# Patient Record
Sex: Male | Born: 1965
Health system: Western US, Academic
[De-identification: ages and names within clinical notes are randomized; demographics above are authoritative.]

## PROBLEM LIST (undated history)

## (undated) DIAGNOSIS — I219 Acute myocardial infarction, unspecified: Secondary | ICD-10-CM

## (undated) DIAGNOSIS — M1990 Unspecified osteoarthritis, unspecified site: Secondary | ICD-10-CM

## (undated) DIAGNOSIS — E785 Hyperlipidemia, unspecified: Secondary | ICD-10-CM

## (undated) DIAGNOSIS — I10 Essential (primary) hypertension: Secondary | ICD-10-CM

## (undated) HISTORY — PX: OTHER SURGICAL HISTORY: SHX169

## (undated) HISTORY — DX: Hyperlipidemia, unspecified: E78.5

## (undated) HISTORY — PX: CARDIAC CATHETERIZATION: SHX172

## (undated) HISTORY — DX: Acute myocardial infarction, unspecified: I21.9

## (undated) HISTORY — PX: TUMOR REMOVAL: SHX12

---

## 2009-05-22 ENCOUNTER — Emergency Department (HOSPITAL_COMMUNITY): Admission: EM | Admit: 2009-05-22 | Discharge: 2009-05-22 | Payer: Self-pay | Admitting: Emergency Medicine

## 2009-07-07 ENCOUNTER — Emergency Department: Payer: Self-pay | Admitting: Emergency Medicine

## 2010-10-19 ENCOUNTER — Inpatient Hospital Stay: Payer: Self-pay | Admitting: Internal Medicine

## 2010-11-02 ENCOUNTER — Encounter: Payer: Self-pay | Admitting: Internal Medicine

## 2010-11-24 ENCOUNTER — Encounter: Payer: Self-pay | Admitting: Internal Medicine

## 2010-11-30 ENCOUNTER — Ambulatory Visit: Payer: Self-pay | Admitting: Internal Medicine

## 2010-12-25 ENCOUNTER — Encounter: Payer: Self-pay | Admitting: Internal Medicine

## 2011-01-24 ENCOUNTER — Encounter: Payer: Self-pay | Admitting: Internal Medicine

## 2011-02-14 ENCOUNTER — Ambulatory Visit: Payer: Self-pay

## 2011-02-14 ENCOUNTER — Other Ambulatory Visit: Payer: Self-pay | Admitting: Occupational Medicine

## 2011-02-14 DIAGNOSIS — Z0000 Encounter for general adult medical examination without abnormal findings: Secondary | ICD-10-CM

## 2013-04-08 ENCOUNTER — Other Ambulatory Visit: Payer: Self-pay | Admitting: Occupational Medicine

## 2013-04-08 ENCOUNTER — Ambulatory Visit: Payer: Self-pay

## 2013-04-08 DIAGNOSIS — Z0000 Encounter for general adult medical examination without abnormal findings: Secondary | ICD-10-CM

## 2013-05-07 ENCOUNTER — Encounter: Payer: Self-pay | Admitting: Family Medicine

## 2013-05-26 ENCOUNTER — Encounter: Payer: Self-pay | Admitting: Family Medicine

## 2013-06-25 ENCOUNTER — Encounter: Payer: Self-pay | Admitting: Family Medicine

## 2013-10-09 ENCOUNTER — Emergency Department: Payer: Self-pay | Admitting: Emergency Medicine

## 2013-10-22 ENCOUNTER — Ambulatory Visit: Payer: Self-pay | Admitting: Orthopedic Surgery

## 2014-03-04 DIAGNOSIS — F32A Depression, unspecified: Secondary | ICD-10-CM | POA: Insufficient documentation

## 2014-03-04 DIAGNOSIS — F329 Major depressive disorder, single episode, unspecified: Secondary | ICD-10-CM | POA: Insufficient documentation

## 2014-03-04 DIAGNOSIS — F419 Anxiety disorder, unspecified: Secondary | ICD-10-CM | POA: Insufficient documentation

## 2014-03-04 DIAGNOSIS — I10 Essential (primary) hypertension: Secondary | ICD-10-CM | POA: Insufficient documentation

## 2014-03-04 DIAGNOSIS — I2510 Atherosclerotic heart disease of native coronary artery without angina pectoris: Secondary | ICD-10-CM | POA: Insufficient documentation

## 2014-03-04 DIAGNOSIS — R0602 Shortness of breath: Secondary | ICD-10-CM | POA: Insufficient documentation

## 2015-01-14 ENCOUNTER — Emergency Department
Admit: 2015-01-14 | Disposition: A | Discharge status: Home / Self Care | Payer: Self-pay | Admitting: Emergency Medicine

## 2015-01-14 LAB — COMPREHENSIVE METABOLIC PANEL WITH GFR
Albumin: 4.3 g/dL
Alkaline Phosphatase: 53 U/L
Anion Gap: 7
BUN: 11 mg/dL
Bilirubin,Total: 0.7 mg/dL
Calcium, Total: 9.1 mg/dL
Chloride: 107 mmol/L
Co2: 25 mmol/L
Creatinine: 0.94 mg/dL
EGFR (African American): >60
EGFR (Non-African Amer.): >60
Glucose: 117 mg/dL — ABNORMAL HIGH
Potassium: 3.6 mmol/L
SGOT(AST): 23 U/L
SGPT (ALT): 17 U/L
Sodium: 139 mmol/L
Total Protein: 7.5 g/dL

## 2015-01-14 LAB — CBC WITH DIFFERENTIAL/PLATELET
BASOS ABS: 0 10*3/uL (ref 0.0–0.1)
Basophil %: 0.5 %
EOS PCT: 6.5 %
Eosinophil #: 0.3 10*3/uL (ref 0.0–0.7)
HCT: 43.3 % (ref 40.0–52.0)
HGB: 14.6 g/dL (ref 13.0–18.0)
LYMPHS PCT: 44.4 %
Lymphocyte #: 2.2 10*3/uL (ref 1.0–3.6)
MCH: 32 pg (ref 26.0–34.0)
MCHC: 33.7 g/dL (ref 32.0–36.0)
MCV: 95 fL (ref 80–100)
MONOS PCT: 9.2 %
Monocyte #: 0.5 x10 3/mm (ref 0.2–1.0)
NEUTROS ABS: 2 10*3/uL (ref 1.4–6.5)
Neutrophil %: 39.4 %
Platelet: 238 10*3/uL (ref 150–440)
RBC: 4.55 10*6/uL (ref 4.40–5.90)
RDW: 13.4 % (ref 11.5–14.5)
WBC: 5 10*3/uL (ref 3.8–10.6)

## 2015-01-27 DIAGNOSIS — K118 Other diseases of salivary glands: Secondary | ICD-10-CM | POA: Insufficient documentation

## 2015-01-27 DIAGNOSIS — R900 Intracranial space-occupying lesion found on diagnostic imaging of central nervous system: Secondary | ICD-10-CM | POA: Insufficient documentation

## 2015-11-28 IMAGING — CT CT NECK WITH CONTRAST
2 of 3 series · 7 of 14 positions shown, 8 images · IV contrast (omnipaque)
Comparison: Chest radiographs 04/08/2013.

CLINICAL DATA: 48-year-old male with palpable abnormality near the
right ear. Right cheek numbness. Denies associated pain. Initial
encounter.

EXAM:
CT NECK WITH CONTRAST
TECHNIQUE: Multidetector CT imaging of the neck was performed using the
standard protocol following the bolus administration of intravenous
contrast.
CONTRAST:  75 mL Omnipaque 300.

[Series 2: axial neck · axial · 0.53mm/px · z∈[+57,+201]mm · 3 of 144 slices shown]
[im 36/144  bone]
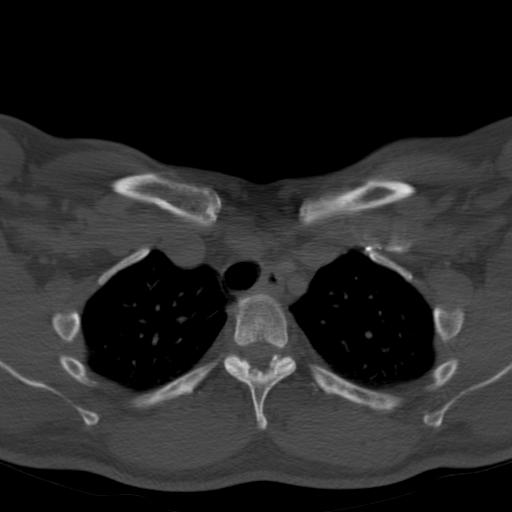
[im 72/144  bone]
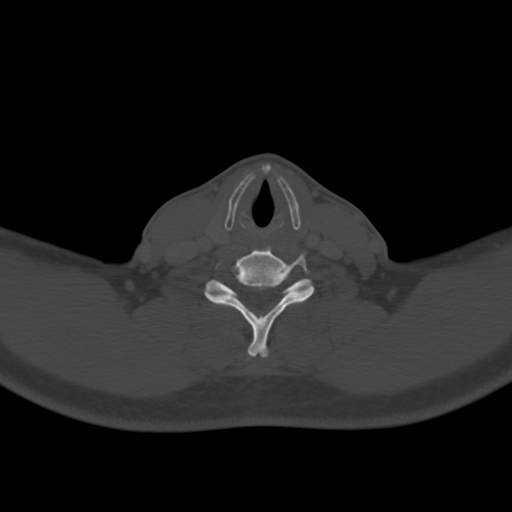
[im 108/144  bone]
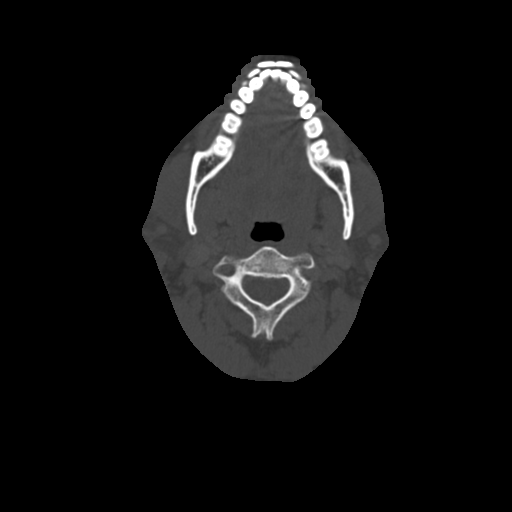

[Series 7: ax oropharynx · axial · 0.43mm/px · z∈[+23,+190]mm · 4 of 147 slices shown, 5 images]
[im 30/147  soft-tissue]
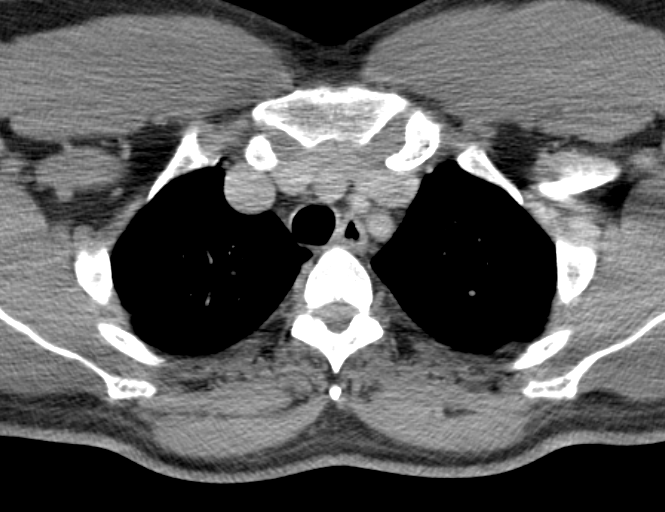
[im 30/147  bone]
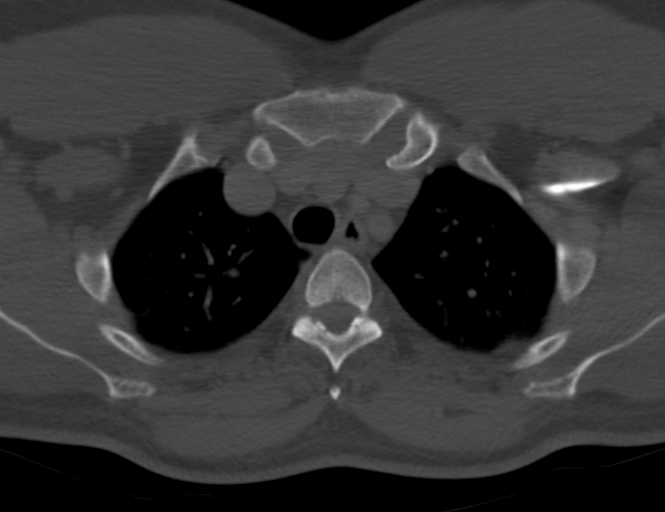
[im 59/147  bone]
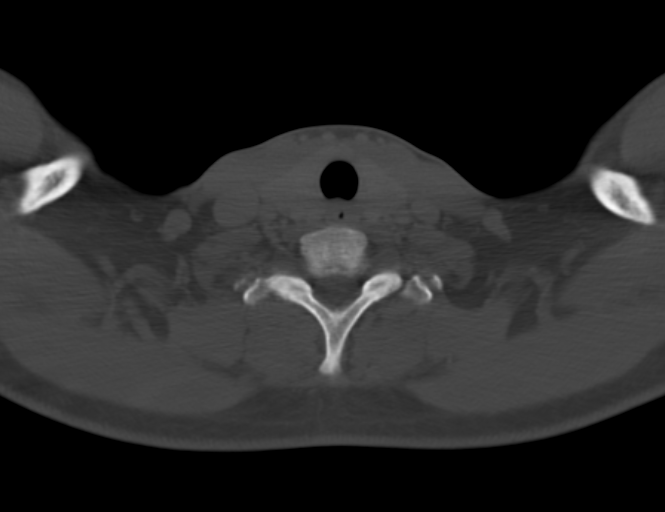
[im 88/147  bone]
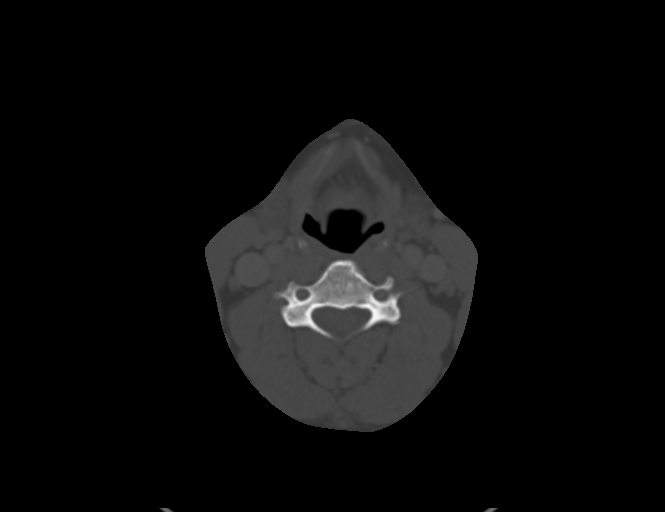
[im 117/147  bone]
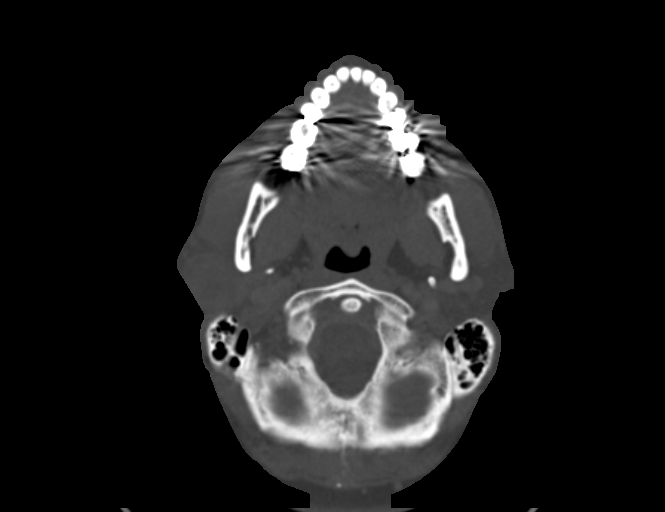

[7 of 14 positions shown; findings below may reference images not displayed]

FINDINGS: Pharynx and larynx: Negative. Negative parapharyngeal spaces,
retropharyngeal space and sublingual space.

Salivary glands:

Negative submandibular and left parotid glands.

The palpable area of concern is marked at the right parotid space
and there is a round heterogeneously enhancing 25 x 32 x 32 mm (AP
by transverse by CC) mass situated in the right parotid space. The
majority of the mass is in the superficial lobe, but it extends to
the level or just medial to the left retromandibular vein.
Questionable asymmetric soft tissue extending toward the right
stylomastoid foramen (series 2, image 19). No regional
lymphadenopathy. No other suspicious right parotid finding.

Thyroid: Negative.

Lymph nodes: No cervical lymphadenopathy.

Vascular: Patent, suboptimal intravascular contrast bolus.

Limited intracranial: Negative.

Visualized orbits: Negative.

Mastoids and visualized paranasal sinuses: The right tympanic cavity
and mastoid air cells are clear. There is bilateral petrous apex and
sphenoid wing pneumatization, those air cells also are clear. No
significant paranasal sinus mucosal thickening.

Skeleton: Degenerative changes in the cervical spine. No acute or
suspicious osseous lesion.

Upper chest: Negative lung apices. No superior mediastinal
lymphadenopathy. No axillary lymphadenopathy.
IMPRESSION: 1. Round 3 cm right parotid space mass most compatible with primary
salivary gland neoplasm. This imaging appearance can be benign or
malignant. Recommend ENT consultation for histologic correlation.
2. Questionable asymmetric soft tissue at the right stylomastoid
foramen. If there is any clinical evidence of right 7th cranial
nerve dysfunction then malignancy with right 7th CN involvement is
likely.
3. No regional lymphadenopathy, and other neck and upper chest soft
tissues are within normal limits.

## 2017-05-15 ENCOUNTER — Telehealth: Payer: Self-pay

## 2017-05-15 ENCOUNTER — Other Ambulatory Visit: Payer: Self-pay

## 2017-05-15 DIAGNOSIS — I219 Acute myocardial infarction, unspecified: Secondary | ICD-10-CM | POA: Insufficient documentation

## 2017-05-15 DIAGNOSIS — Z1211 Encounter for screening for malignant neoplasm of colon: Secondary | ICD-10-CM

## 2017-05-15 DIAGNOSIS — E785 Hyperlipidemia, unspecified: Secondary | ICD-10-CM | POA: Insufficient documentation

## 2017-05-15 NOTE — Telephone Encounter (Signed)
Gastroenterology Pre-Procedure Review  Request Date: 9/5 Requesting Physician: Dr. Allegra Lai  PATIENT REVIEW QUESTIONS: The patient responded to the following health history questions as indicated:    1. Are you having any GI issues? no 2. Do you have a personal history of Polyps? no 3. Do you have a family history of Colon Cancer or Polyps? no 4. Diabetes Mellitus? no 5. Joint replacements in the past 12 months?no 6. Major health problems in the past 3 months?no 7. Any artificial heart valves, MVP, or defibrillator?yes (2 stents due to MI)    MEDICATIONS & ALLERGIES:    Patient reports the following regarding taking any anticoagulation/antiplatelet therapy:   Plavix, Coumadin, Eliquis, Xarelto, Lovenox, Pradaxa, Brilinta, or Effient? yes (Effient) Aspirin? no  Patient confirms/reports the following medications:  No current outpatient prescriptions on file.   No current facility-administered medications for this visit.     Patient confirms/reports the following allergies:  Allergies not on file  No orders of the defined types were placed in this encounter.   AUTHORIZATION INFORMATION Primary Insurance: 1D#: Group #:  Secondary Insurance: 1D#: Group #:  SCHEDULE INFORMATION: Date: 9/5 Time: Location: [facility]

## 2017-05-25 ENCOUNTER — Telehealth: Payer: Self-pay

## 2017-05-25 NOTE — Telephone Encounter (Signed)
LVM for Carmelia Roller to have Erick Alley to call me for information.   Advised no voicemail setup on phone. No answer to call.   *Effient cleared by Dr. Juliann Pares. Stop 3 days prior. Restart 1 day following.

## 2017-05-29 ENCOUNTER — Ambulatory Visit: Payer: Self-pay

## 2017-05-29 ENCOUNTER — Other Ambulatory Visit: Payer: Self-pay | Admitting: Occupational Medicine

## 2017-05-29 ENCOUNTER — Encounter: Payer: Self-pay | Admitting: *Deleted

## 2017-05-29 ENCOUNTER — Other Ambulatory Visit: Payer: Self-pay

## 2017-05-29 DIAGNOSIS — Z86010 Personal history of colonic polyps: Secondary | ICD-10-CM

## 2017-05-29 DIAGNOSIS — Z0000 Encounter for general adult medical examination without abnormal findings: Secondary | ICD-10-CM

## 2017-05-29 MED ORDER — PEG 3350-KCL-NA BICARB-NACL 420 G PO SOLR: 4000.0000 mL | Freq: Once | ORAL | 0 refills | Status: DC

## 2017-05-29 MED ORDER — PEG 3350-KCL-NA BICARB-NACL 420 G PO SOLR: 4000.0000 mL | Freq: Once | ORAL | 0 refills | Status: AC

## 2017-06-01 ENCOUNTER — Ambulatory Visit
Admission: RE | Admit: 2017-06-01 | Discharge: 2017-06-01 | Disposition: A | Discharge status: Home / Self Care | Payer: Medicare Other | Source: Ambulatory Visit | Attending: Gastroenterology | Admitting: Gastroenterology

## 2017-06-01 ENCOUNTER — Encounter
Admission: RE | Disposition: A | Discharge status: Home / Self Care | Payer: Self-pay | Source: Ambulatory Visit | Attending: Gastroenterology

## 2017-06-01 ENCOUNTER — Telehealth: Payer: Self-pay

## 2017-06-01 ENCOUNTER — Ambulatory Visit: Payer: Medicare Other | Admitting: Anesthesiology

## 2017-06-01 ENCOUNTER — Encounter: Payer: Self-pay | Admitting: *Deleted

## 2017-06-01 DIAGNOSIS — I252 Old myocardial infarction: Secondary | ICD-10-CM | POA: Insufficient documentation

## 2017-06-01 DIAGNOSIS — I2510 Atherosclerotic heart disease of native coronary artery without angina pectoris: Secondary | ICD-10-CM | POA: Insufficient documentation

## 2017-06-01 DIAGNOSIS — Z79899 Other long term (current) drug therapy: Secondary | ICD-10-CM | POA: Diagnosis not present

## 2017-06-01 DIAGNOSIS — Z1211 Encounter for screening for malignant neoplasm of colon: Secondary | ICD-10-CM | POA: Diagnosis present

## 2017-06-01 DIAGNOSIS — Z955 Presence of coronary angioplasty implant and graft: Secondary | ICD-10-CM | POA: Insufficient documentation

## 2017-06-01 DIAGNOSIS — E785 Hyperlipidemia, unspecified: Secondary | ICD-10-CM | POA: Diagnosis not present

## 2017-06-01 HISTORY — PX: COLONOSCOPY WITH PROPOFOL: SHX5780

## 2017-06-01 SURGERY — COLONOSCOPY WITH PROPOFOL
Anesthesia: General

## 2017-06-01 MED ORDER — MIDAZOLAM HCL 2 MG/2ML IJ SOLN
INTRAMUSCULAR | Status: DC | PRN
  Administered 2017-06-01 (×2): 1 mg via INTRAVENOUS

## 2017-06-01 MED ORDER — PROPOFOL 500 MG/50ML IV EMUL
INTRAVENOUS | Status: AC
  Filled 2017-06-01: qty 50

## 2017-06-01 MED ORDER — SODIUM CHLORIDE 0.9 % IV SOLN
INTRAVENOUS | Status: DC
  Administered 2017-06-01: 11:00:00 via INTRAVENOUS

## 2017-06-01 MED ORDER — PROPOFOL 500 MG/50ML IV EMUL
INTRAVENOUS | Status: DC | PRN
  Administered 2017-06-01: 150 ug/kg/min via INTRAVENOUS

## 2017-06-01 MED ORDER — PROPOFOL 10 MG/ML IV BOLUS
INTRAVENOUS | Status: DC | PRN
  Administered 2017-06-01: 70 mg via INTRAVENOUS

## 2017-06-01 MED ORDER — LIDOCAINE HCL (CARDIAC) 20 MG/ML IV SOLN
INTRAVENOUS | Status: DC | PRN
  Administered 2017-06-01: 40 mg via INTRAVENOUS

## 2017-06-01 MED ORDER — MIDAZOLAM HCL 2 MG/2ML IJ SOLN
INTRAMUSCULAR | Status: AC
  Filled 2017-06-01: qty 2

## 2017-06-01 NOTE — Telephone Encounter (Signed)
LVM for patient callback to advise medication clearance received from Dr. Juliann Pares.   Stop Effient 3 day prior to procedure.  Re-start 1 day following.

## 2017-06-01 NOTE — Op Note (Signed)
[facility] Patient Name: Gregory Banks Procedure Date: 06/01/2017 11:55 AM MRN: [id number] Account #: [id number] Date of Birth: 02-Sep-1966 Admit Type: Outpatient Age: 51 Room: [facility] ENDO ROOM 3 Gender: Male Note Status: Finalized Procedure:            Colonoscopy Indications:          Screening for colorectal malignant neoplasm Providers:            Wyline Mood MD, MD Referring MD:         No Local Md, MD (Referring MD) Medicines:            Monitored Anesthesia Care Complications:        No immediate complications. Procedure:            Pre-Anesthesia Assessment:                       - Prior to the procedure, a History and Physical was                        performed, and patient medications, allergies and                        sensitivities were reviewed. The patient's tolerance of                        previous anesthesia was reviewed.                       - The risks and benefits of the procedure and the                        sedation options and risks were discussed with the                        patient. All questions were answered and informed                        consent was obtained.                       - ASA Grade Assessment: III - A patient with severe                        systemic disease.                       After obtaining informed consent, the colonoscope was                        passed under direct vision. Throughout the procedure,                        the patient's blood pressure, pulse, and oxygen                        saturations were monitored continuously. The Olympus                        CF-H180AL colonoscope ( S#: [id number] ) was introduced  through the anus and advanced to the the cecum,                        identified by the appendiceal orifice, IC valve and                        transillumination. The colonoscopy was performed with                        ease. The patient  tolerated the procedure well. The                        quality of the bowel preparation was good. Findings:      The perianal and digital rectal examinations were normal.      The exam was otherwise without abnormality on direct and retroflexion       views. Impression:           - The examination was otherwise normal on direct and                        retroflexion views.                       - No specimens collected. Recommendation:       - Discharge patient to home (with escort).                       - Resume previous diet.                       - Repeat colonoscopy in 10 years for screening purposes. Procedure Code(s):    --- Professional ---                       M1962, Colorectal cancer screening; colonoscopy on                        individual not meeting criteria for high risk Diagnosis Code(s):    --- Professional ---                       Z12.11, Encounter for screening for malignant neoplasm                        of colon CPT copyright 2016 American Medical Association. All rights reserved. The codes documented in this report are preliminary and upon coder review may  be revised to meet current compliance requirements. Wyline Mood, MD Wyline Mood MD, MD 06/01/2017 12:17:58 PM This report has been signed electronically. Number of Addenda: 0 Note Initiated On: 06/01/2017 11:55 AM Scope Withdrawal Time: 0 hours 15 minutes 49 seconds  Total Procedure Duration: 0 hours 18 minutes 1 second       [facility]

## 2017-06-01 NOTE — Anesthesia Post-op Follow-up Note (Signed)
Anesthesia QCDR form completed.        

## 2017-06-01 NOTE — Anesthesia Postprocedure Evaluation (Signed)
Anesthesia Post Note  Patient: [name].  Procedure(s) Performed: Procedure(s) (LRB): COLONOSCOPY WITH PROPOFOL (N/A)  Patient location during evaluation: Endoscopy Anesthesia Type: General Level of consciousness: awake and alert Pain management: pain level controlled Vital Signs Assessment: post-procedure vital signs reviewed and stable Respiratory status: spontaneous breathing, nonlabored ventilation, respiratory function stable and patient connected to nasal cannula oxygen Cardiovascular status: blood pressure returned to baseline and stable Postop Assessment: no signs of nausea or vomiting Anesthetic complications: no     Last Vitals:  Vitals:   06/01/17 1237 06/01/17 1247  BP: 107/66 98/83  Pulse: (!) 49 (!) 53  Resp: 14 17  Temp:    SpO2: 100% 100%    Last Pain:  Vitals:   06/01/17 1227  TempSrc:   PainSc: Asleep                 Cleda Mccreedy Kloee Ballew

## 2017-06-01 NOTE — Anesthesia Preprocedure Evaluation (Signed)
Anesthesia Evaluation  Patient identified by MRN, date of birth, ID band Patient awake    Reviewed: Allergy & Precautions, H&P , NPO status , Patient's Chart, lab work & pertinent test results  History of Anesthesia Complications Negative for: history of anesthetic complications  Airway Mallampati: II  TM Distance: >3 FB Neck ROM: full    Dental  (+) Poor Dentition   Pulmonary neg pulmonary ROS, neg shortness of breath,           Cardiovascular Exercise Tolerance: Good hypertension, (-) angina+ CAD, + Past MI and + Cardiac Stents  (-) DOE      Neuro/Psych PSYCHIATRIC DISORDERS negative neurological ROS     GI/Hepatic negative GI ROS, Neg liver ROS, neg GERD  ,  Endo/Other  negative endocrine ROS  Renal/GU negative Renal ROS  negative genitourinary   Musculoskeletal   Abdominal   Peds  Hematology negative hematology ROS (+)   Anesthesia Other Findings Past Medical History: No date: Hyperlipidemia No date: MI (myocardial infarction) (HCC)  Past Surgical History: No date: CARDIAC CATHETERIZATION     Comment:  Angioplasty with stent placement x 2 No date: OTHER SURGICAL HISTORY     Comment:  2 Cardiac stents  BMI    Body Mass Index:  27.60 kg/m      Reproductive/Obstetrics negative OB ROS                             Anesthesia Physical Anesthesia Plan  ASA: III  Anesthesia Plan: General   Post-op Pain Management:    Induction: Intravenous  PONV Risk Score and Plan: Propofol infusion  Airway Management Planned: Natural Airway and Nasal Cannula  Additional Equipment:   Intra-op Plan:   Post-operative Plan:   Informed Consent: I have reviewed the patients History and Physical, chart, labs and discussed the procedure including the risks, benefits and alternatives for the proposed anesthesia with the patient or authorized representative who has indicated his/her  understanding and acceptance.   Dental Advisory Given  Plan Discussed with: Anesthesiologist, CRNA and Surgeon  Anesthesia Plan Comments: (Patient consented for risks of anesthesia including but not limited to:  - adverse reactions to medications - risk of intubation if required - damage to teeth, lips or other oral mucosa - sore throat or hoarseness - Damage to heart, brain, lungs or loss of life  Patient voiced understanding.)        Anesthesia Quick Evaluation

## 2017-06-01 NOTE — Transfer of Care (Signed)
Immediate Anesthesia Transfer of Care Note  Patient: Gregory Banks.  Procedure(s) Performed: Procedure(s): COLONOSCOPY WITH PROPOFOL (N/A)  Patient Location: PACU  Anesthesia Type:General  Level of Consciousness: awake, alert  and oriented  Airway & Oxygen Therapy: Patient Spontanous Breathing and Patient connected to nasal cannula oxygen  Post-op Assessment: Report given to RN and Post -op Vital signs reviewed and stable  Post vital signs: Reviewed and stable  Last Vitals:  Vitals:   06/01/17 1046 06/01/17 1217  BP: 126/88 (!) 99/58  Pulse: (!) 57 69  Resp: 14 12  Temp: (!) 36.1 C (!) 35.8 C  SpO2: 100% 98%    Last Pain:  Vitals:   06/01/17 1217  TempSrc: Tympanic         Complications: No apparent anesthesia complications

## 2017-06-01 NOTE — H&P (Signed)
  Wyline Mood MD [address], [address] [place], [place] [address] Phone: [phone] Fax : [phone]  Primary Care Physician:  [facility] Primary Gastroenterologist:  Dr. Wyline Mood   Pre-Procedure History & Physical: HPI:  Gregory Banks. is a 51 y.o. male is here for an colonoscopy.   Past Medical History:  Diagnosis Date  . Hyperlipidemia   . MI (myocardial infarction) [facility])     Past Surgical History:  Procedure Laterality Date  . CARDIAC CATHETERIZATION     Angioplasty with stent placement x 2  . OTHER SURGICAL HISTORY     2 Cardiac stents    Prior to Admission medications   Medication Sig Start Date End Date Taking? Authorizing Provider  lisinopril (PRINIVIL,ZESTRIL) 20 MG tablet Take 20 mg by mouth daily.    Callwood, Dwayne D, MD  Metoprolol Succinate 25 MG CS24 Take 25 mg by mouth daily.    Callwood, Dwayne D, MD  nitroGLYCERIN (NITROSTAT) 0.4 MG SL tablet Place under the tongue. 03/04/14   [provider]  prasugrel (EFFIENT) 10 MG TABS tablet Take 10 mg by mouth daily.    Callwood, Dwayne D, MD  pravastatin (PRAVACHOL) 10 MG tablet Take 10 mg by mouth daily.    Alwyn Pea, MD    Allergies as of 05/15/2017  . (No Known Allergies)    History reviewed. No pertinent family history.  Social History   Social History  . Marital status: Married    Spouse name: N/A  . Number of children: N/A  . Years of education: N/A   Occupational History  . Not on file.   Social History Main Topics  . Smoking status: Never Smoker  . Smokeless tobacco: Never Used  . Alcohol use No  . Drug use: No  . Sexual activity: Not on file   Other Topics Concern  . Not on file   Social History Narrative  . No narrative on file    Review of Systems: See HPI, otherwise negative ROS  Physical Exam: BP 126/88   Pulse (!) 57   Temp (!) 96.9 F (36.1 C) (Tympanic)   Resp 14   Ht 6\' 7"  (2.007 m)   Wt 245 lb (111.1 kg)   SpO2  100%   BMI 27.60 kg/m  General:   Alert,  pleasant and cooperative in NAD Head:  Normocephalic and atraumatic. Neck:  Supple; no masses or thyromegaly. Lungs:  Clear throughout to auscultation.    Heart:  Regular rate and rhythm. Abdomen:  Soft, nontender and nondistended. Normal bowel sounds, without guarding, and without rebound.   Neurologic:  Alert and  oriented x4;  grossly normal neurologically.  Impression/Plan: Dilon Sabetta. is here for an colonoscopy to be performed for Screening colonoscopy average risk    Risks, benefits, limitations, and alternatives regarding  colonoscopy have been reviewed with the patient.  Questions have been answered.  All parties agreeable.   Wyline Mood, MD  06/01/2017, 11:46 AM

## 2017-06-04 ENCOUNTER — Encounter: Payer: Self-pay | Admitting: Gastroenterology

## 2018-09-12 ENCOUNTER — Emergency Department
Admission: EM | Admit: 2018-09-12 | Discharge: 2018-09-12 | Disposition: A | Discharge status: Home / Self Care | Payer: Medicare Other | Attending: Emergency Medicine | Admitting: Emergency Medicine

## 2018-09-12 ENCOUNTER — Emergency Department: Payer: Medicare Other

## 2018-09-12 ENCOUNTER — Other Ambulatory Visit: Payer: Self-pay

## 2018-09-12 ENCOUNTER — Encounter: Payer: Self-pay | Admitting: Emergency Medicine

## 2018-09-12 DIAGNOSIS — Z79899 Other long term (current) drug therapy: Secondary | ICD-10-CM | POA: Insufficient documentation

## 2018-09-12 DIAGNOSIS — Y9351 Activity, roller skating (inline) and skateboarding: Secondary | ICD-10-CM | POA: Insufficient documentation

## 2018-09-12 DIAGNOSIS — Z7902 Long term (current) use of antithrombotics/antiplatelets: Secondary | ICD-10-CM | POA: Insufficient documentation

## 2018-09-12 DIAGNOSIS — Y998 Other external cause status: Secondary | ICD-10-CM | POA: Diagnosis not present

## 2018-09-12 DIAGNOSIS — S62112A Displaced fracture of triquetrum [cuneiform] bone, left wrist, initial encounter for closed fracture: Secondary | ICD-10-CM

## 2018-09-12 DIAGNOSIS — I252 Old myocardial infarction: Secondary | ICD-10-CM | POA: Diagnosis not present

## 2018-09-12 DIAGNOSIS — Y92331 Roller skating rink as the place of occurrence of the external cause: Secondary | ICD-10-CM | POA: Diagnosis not present

## 2018-09-12 DIAGNOSIS — S6992XA Unspecified injury of left wrist, hand and finger(s), initial encounter: Secondary | ICD-10-CM | POA: Diagnosis present

## 2018-09-12 MED ORDER — OXYCODONE-ACETAMINOPHEN 5-325 MG PO TABS
1.0000 | ORAL_TABLET | Freq: Once | ORAL | Status: AC
  Administered 2018-09-12: 1 via ORAL
  Filled 2018-09-12: qty 1

## 2018-09-12 MED ORDER — HYDROCODONE-ACETAMINOPHEN 5-325 MG PO TABS: 1.0000 | ORAL_TABLET | ORAL | 0 refills | Status: AC | PRN

## 2018-09-12 MED ORDER — ONDANSETRON 4 MG PO TBDP
4.0000 mg | ORAL_TABLET | Freq: Once | ORAL | Status: AC
  Administered 2018-09-12: 4 mg via ORAL
  Filled 2018-09-12: qty 1

## 2018-09-12 NOTE — ED Notes (Signed)
Thumb spica splint placed by ED tech Misty Stanley. Capilary refill less than 3 seconds.

## 2018-09-12 NOTE — ED Notes (Signed)
Patient states he was skating with his daughter and fell on left hand earlier in the evening 12/18. Patient states he tried ice to help pain and swelling and then pain become unbearable and decided to be seen here in ER. Patient rate pain at this time 10/10. Pain medication given.

## 2018-09-12 NOTE — ED Provider Notes (Signed)
[facility] Emergency Department Provider Note _____   First MD Initiated Contact with Patient 09/12/18 0207     (approximate)  I have reviewed the triage vital signs and the nursing notes.   HISTORY  Chief Complaint Wrist Pain    HPI Gregory RamsLennox Hoobler Jr. is a 52 y.o. male with below list of chronic medical conditions presents to the emergency department with 9 out of 10 left wrist pain which began after fall while rollerskating.  Patient showed me a video of the fall which revealed fall on outstretched hand predominately on the left initially.  Patient did not suffer any head injury as a result of the fall.  Pain is worse with any movement of the left wrist.  Past Medical History:  Diagnosis Date  . Hyperlipidemia   . MI (myocardial infarction) [facility](HCC)     Patient Active Problem List   Diagnosis Date Noted  . Hyperlipidemia   . MI (myocardial infarction) (HCC)   . 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 Anxiety 03/04/2014  . Chronic coronary artery disease 03/04/2014  . Depression 03/04/2014  . Hypertension 03/04/2014  . Shortness of breath 03/04/2014    Past Surgical History:  Procedure Laterality Date  . CARDIAC CATHETERIZATION     Angioplasty with stent placement x 2  . COLONOSCOPY WITH PROPOFOL N/A 06/01/2017   Procedure: COLONOSCOPY WITH PROPOFOL;  Surgeon: Wyline MoodAnna, Kiran, MD;  [facility] END[facility]COPY;  Service: Gastroenterology;  Laterality: N/A;  . OTHER SURGICAL HISTORY     2 Cardiac stents    Prior to Admission medications   Medication Sig Start Date End Date Taking? Authorizing Provider  lisinopril (PRINIVIL,ZESTRIL) 20 MG tablet Take 20 mg by mouth daily.    Callwood, Dwayne D, MD  Metoprolol Succinate 25 MG CS24 Take 25 mg by mouth daily.    Callwood, Dwayne D, MD  nitroGLYCERIN (NITROSTAT) 0.4 MG SL tablet Place under the tongue. 03/04/14   [provider]  prasugrel (EFFIENT) 10 MG TABS tablet Take 10 mg  by mouth daily.    Callwood, Dwayne D, MD  pravastatin (PRAVACHOL) 10 MG tablet Take 10 mg by mouth daily.    Callwood, Bobbie Stackwayne D, MD    Allergies Oxycodone-acetaminophen  No family history on file.  Social History Social History   Tobacco Use  . Smoking status: Never Smoker  . Smokeless tobacco: Never Used  Substance Use Topics  . Alcohol use: No  . Drug use: No    Review of Systems Constitutional: No fever/chills Eyes: No visual changes. ENT: No sore throat. Cardiovascular: Denies chest pain. Respiratory: Denies shortness of breath. Gastrointestinal: No abdominal pain.  No nausea, no vomiting.  No diarrhea.  No constipation. Genitourinary: Negative for dysuria. Musculoskeletal: Negative for neck pain.  Negative for back pain.  Positive for left wrist pain Integumentary: Negative for rash. Neurological: Negative for headaches, focal weakness or numbness.  ____________________________________________   PHYSICAL EXAM:  VITAL SIGNS: ED Triage Vitals  Enc Vitals Group     BP 09/12/18 0112 (!) 148/88     Pulse Rate 09/12/18 0112 74     Resp 09/12/18 0112 20     Temp 09/12/18 0112 98 F (36.7 C)     Temp Source 09/12/18 0112 Oral     SpO2 09/12/18 0112 97 %     Weight 09/12/18 0110 117.9 kg (260 lb)     Height 09/12/18 0110 2.007 m (6\' 7" )     Head Circumference --  Peak Flow --      Pain Score 09/12/18 0110 10     Pain Loc --      Pain Edu? --      Excl. in GC? --     Constitutional: Alert and oriented. Well appearing and in no acute distress. Eyes: Conjunctivae are normal.  Head: Atraumatic. Mouth/Throat: Mucous membranes are moist. Oropharynx non-erythematous. Neck: No stridor.   Musculoskeletal: Pain with gentle palpation diffusely on the wrists predominantly inferior to the thumb.  Pain with active and passive range of motion of the left wrist.  No gross abnormality to the elbow on exam.  Neurologic:  Normal speech and language. No gross focal  neurologic deficits are appreciated.  Skin:  Skin is warm, dry and intact. No rash noted. Psychiatric: Mood and affect are normal. Speech and behavior are normal.   RADIOLOGY I, [place] N , personally viewed and evaluated these images (plain radiographs) as part of my medical decision making, as well as reviewing the written report by the radiologist.  ED MD interpretation: Cute displaced and mildly comminuted fracture of the left triquetrum on x-ray per radiologist.  Official radiology report(s): Dg Wrist Complete Left  Result Date: 09/12/2018 CLINICAL DATA:  Left wrist pain after fall while roller-skating. EXAM: LEFT WRIST - COMPLETE 3+ VIEW COMPARISON:  None. FINDINGS: Acute displaced and mildly comminuted fracture of the triquetrum with dorsal soft tissue swelling. Degenerative changes in the radiocarpal joint. Degenerative changes in the STT joint. Distal radius and ulna appear intact. IMPRESSION: Acute displaced and mildly comminuted fracture of the triquetrum. Electronically Signed   By: Burman Nieves M.D.   On: 09/12/2018 01:55      Procedures   ____________________________________________   INITIAL IMPRESSION / ASSESSMENT AND PLAN / ED COURSE  As part of my medical decision making, I reviewed the following data within the electronic MEDICAL RECORD NUMBER   52 year old male presented with above-stated history and physical exam secondary to a left triquetrum fracture.  Patient given Percocet and Zofran in the emergency department.  Thumb spica splint applied.  Patient will be referred to Dr. ____________________________________________  FINAL CLINICAL IMPRESSION(S) / ED DIAGNOSES  Final diagnoses:  Displaced fracture of triquetrum (cuneiform) bone, left wrist, initial encounter for closed fracture     MEDICATIONS GIVEN DURING THIS VISIT:  Medications  ondansetron (ZOFRAN-ODT) disintegrating tablet 4 mg (4 mg Oral Given 09/12/18 0232)  oxyCODONE-acetaminophen  (PERCOCET/ROXICET) 5-325 MG per tablet 1 tablet (1 tablet Oral Given 09/12/18 0231)     ED Discharge Orders    None       Note:  This document was prepared using Dragon voice recognition software and may include unintentional dictation errors.    Darci Current, MD 09/12/18 [phone]

## 2018-09-12 NOTE — ED Notes (Signed)
Patient denies pain. Discharge paperwork reviewed with patient. Patient educated on pain management and follow up appointment with ortho. Patient states understanding and signed discharge paperwork.

## 2018-09-12 NOTE — ED Triage Notes (Signed)
Patient ambulatory to triage with steady gait, without difficulty or distress noted; pt reports approx 8pm, fell while roller skating with her daughter, catching himself with his hand; c/o left wrist pain since

## 2019-07-27 IMAGING — CR DG WRIST COMPLETE 3+V*L*
1 series · 3 of 3 positions shown · non-contrast
Comparison: None.

CLINICAL DATA: Left wrist pain after fall while roller-skating.

EXAM:
LEFT WRIST - COMPLETE 3+ VIEW

[Series 3: x wrist lat left · 0.14mm/px · 3 of 3 slices shown]
[im 1/3]
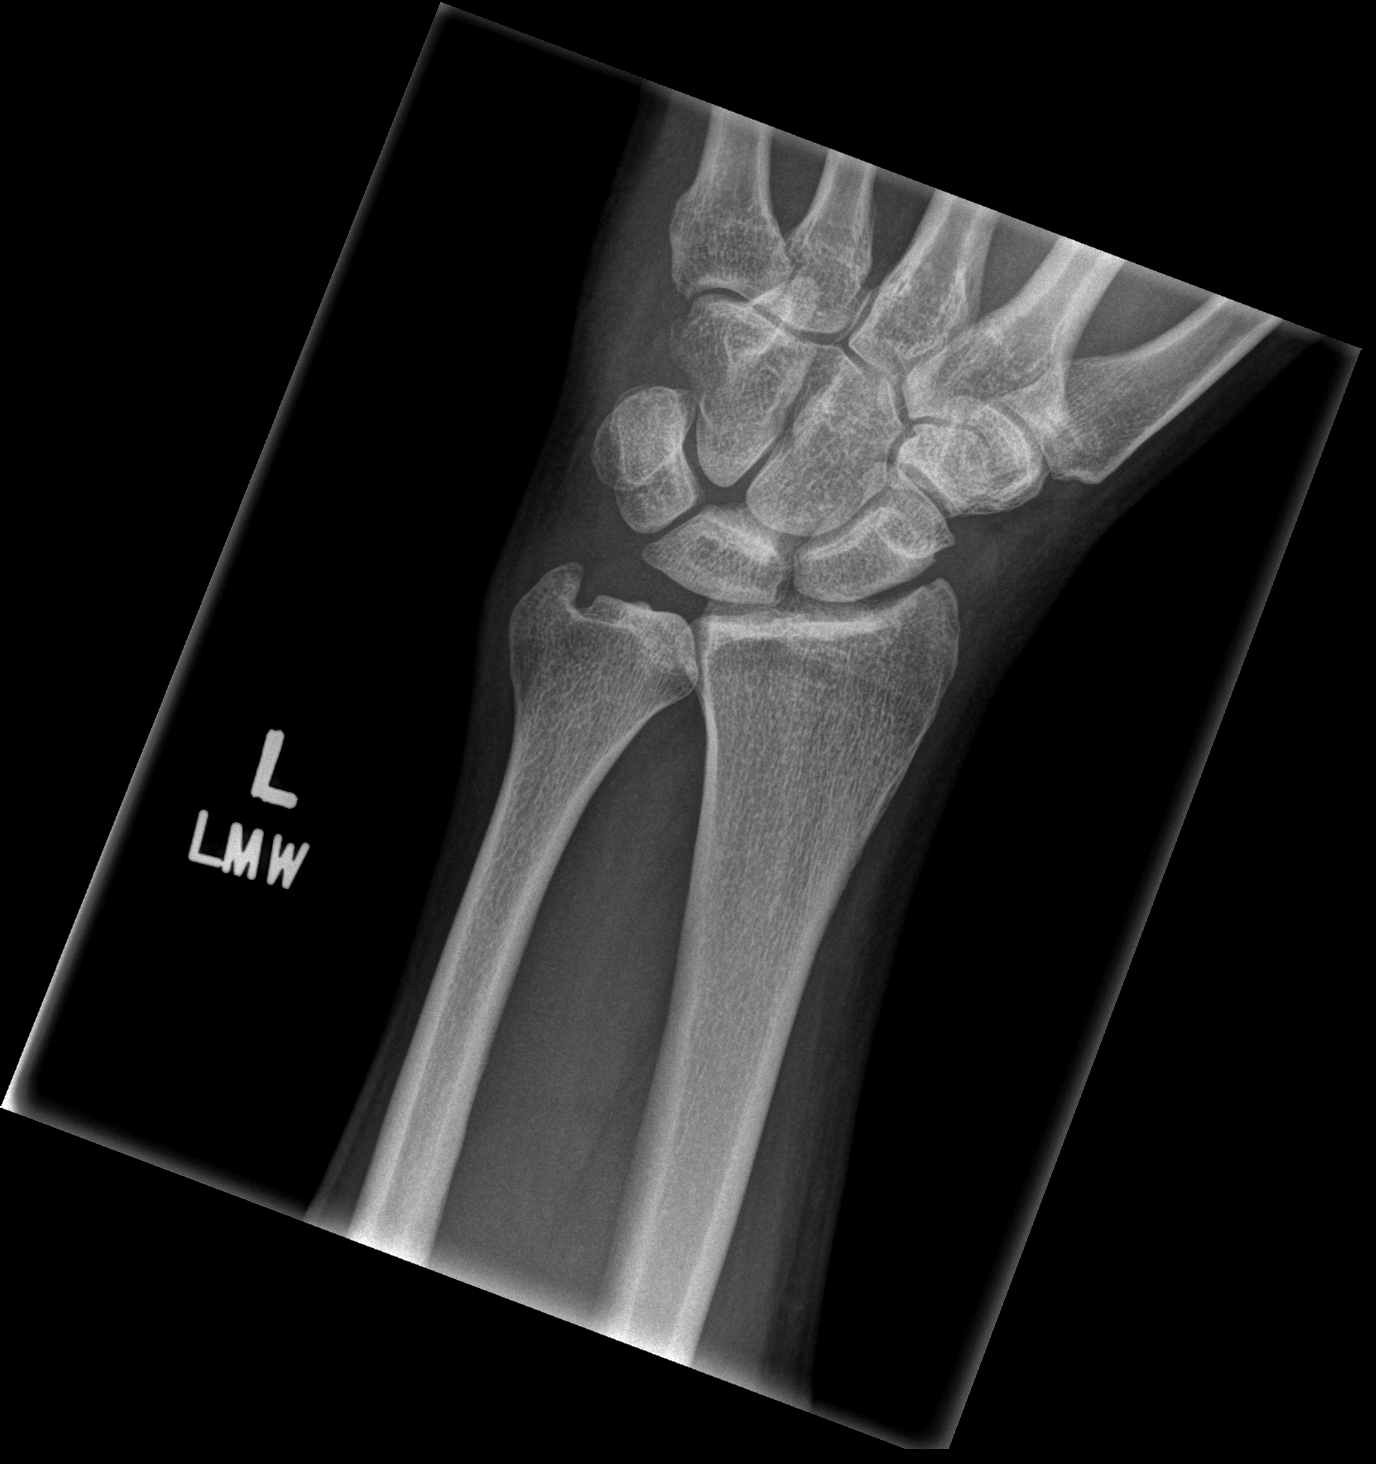
[im 2/3]
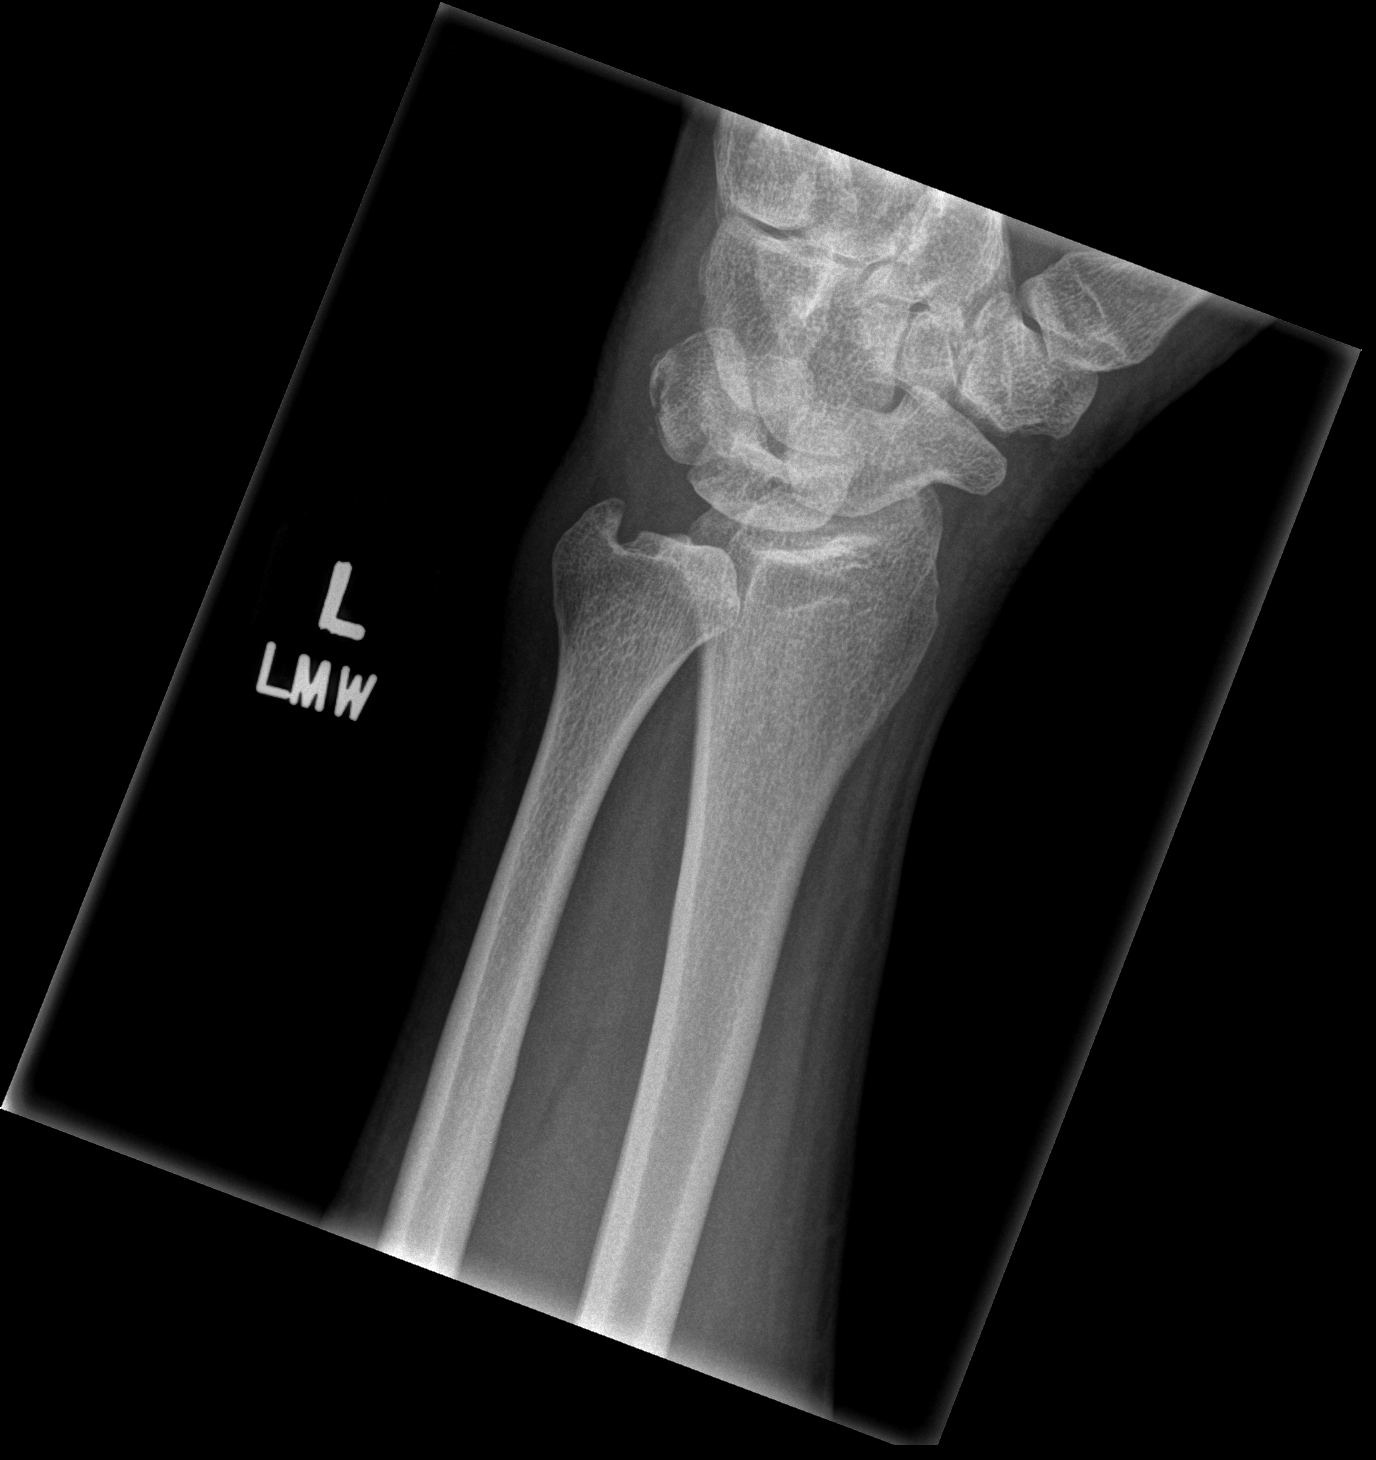
[im 3/3]
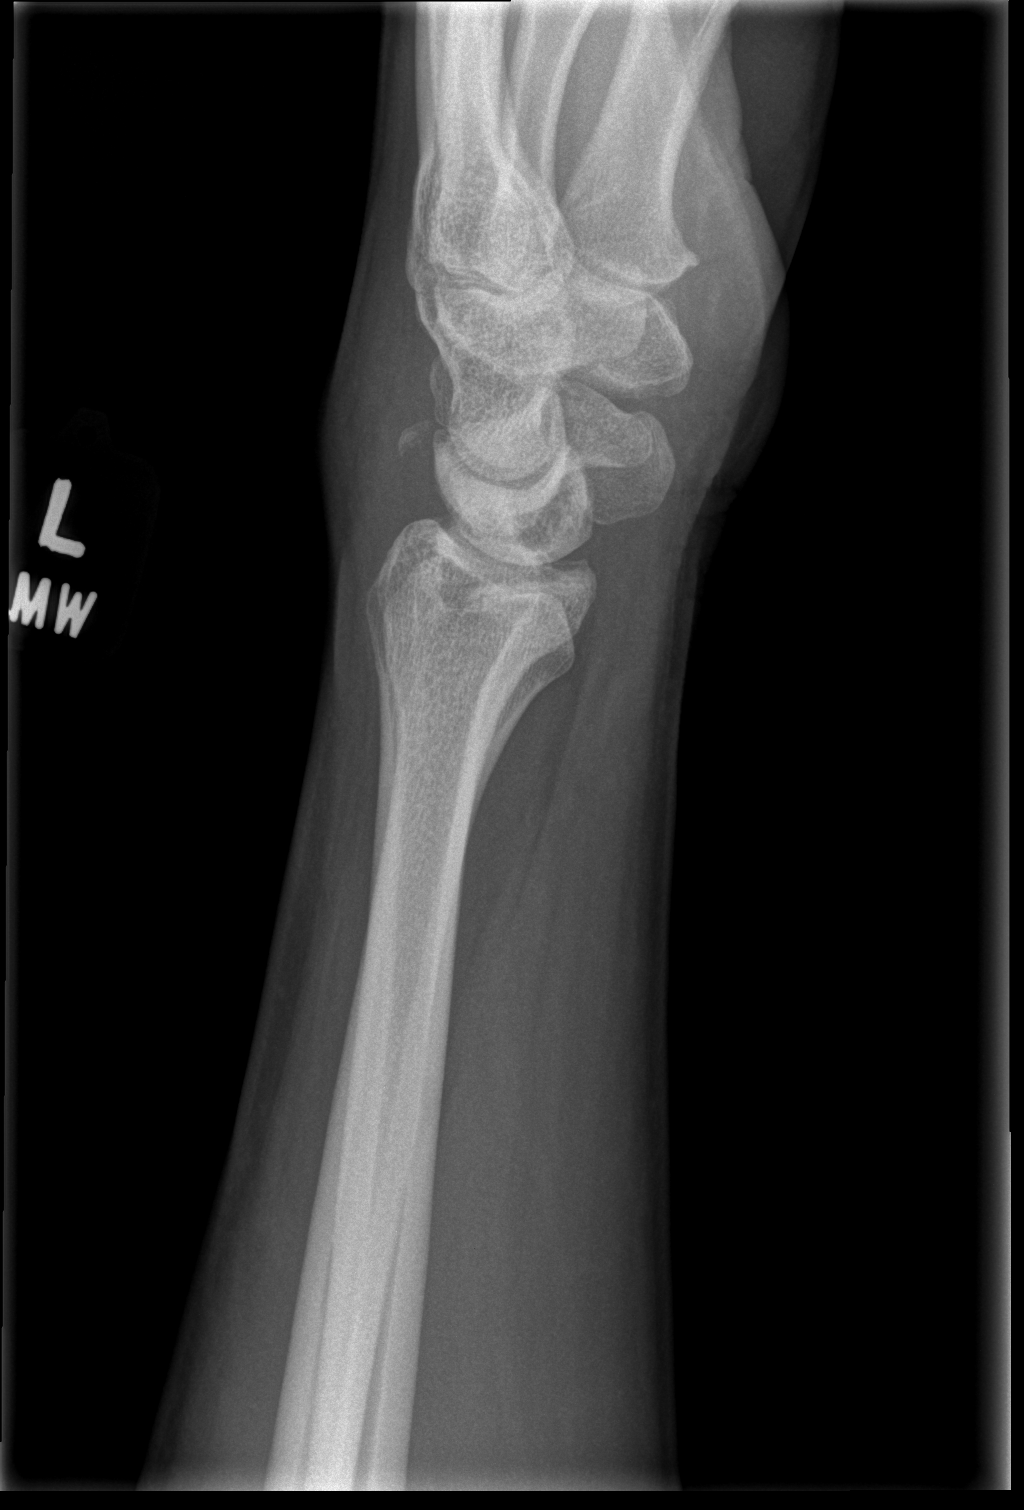

[3 of 3 positions shown; findings below may reference images not displayed]

FINDINGS: Acute displaced and mildly comminuted fracture of the triquetrum
with dorsal soft tissue swelling. Degenerative changes in the
radiocarpal joint. Degenerative changes in the STT joint. Distal
radius and ulna appear intact.
IMPRESSION: Acute displaced and mildly comminuted fracture of the triquetrum.

## 2020-09-20 ENCOUNTER — Inpatient Hospital Stay
Admit: 2020-09-20 | Discharge: 2020-09-20 | Disposition: A | Discharge status: Home / Self Care | Payer: Commercial Managed Care - PPO | Source: Home / Self Care

## 2020-09-20 DIAGNOSIS — J069 Acute upper respiratory infection, unspecified: Secondary | ICD-10-CM

## 2020-09-20 LAB — COVID-19 PCR: COVID-19 PCR: NOT DETECTED

## 2020-09-20 NOTE — ED Provider Notes
Ardy[facility]  Emergency Department Service Report    Triage     Curtis Hebert., a 54 y.o. male, presents with ILI/PUI (possible exposure to COVID) and Cough    Arrived on 09/20/2020 at 7:45 AM   Arrived by Car [5]    ED Triage Vitals   Temp Temp Source BP Heart Rate Resp SpO2 O2 Device Pain Score Weight   09/20/20 0815 09/20/20 0815 09/20/20 0815 09/20/20 0815 09/20/20 0815 09/20/20 0815 09/20/20 0815 09/20/20 0815 09/20/20 0818   (!) 35.8 ???C (96.5 ???F) Temporal 141/84 54 18 99 % None (Room air) Zero (!) 111.1 kg (245 lb)       No Known Allergies     Initial Physician Contact     Medical Screening Exam Initiated     Date/Time Event User Comments    09/20/20 0749 MSE Initiated Kingsley Spittle W                History   HPI    54y/o M no pmhx presents for exposure to covid. His daughter at home has had cough and fever x 2 days. He is currently asymptomatic but shares space with her. He is fully vaccinated for covid but has not had a booster shot. He denies fever, cough, sob, chest pain, n/v/d or other sx.     History reviewed. No pertinent past medical history.     History reviewed. No pertinent surgical history.     Past Family History   family history is not on file.     Past Social History   he has no history on file for tobacco use, alcohol use, drug use, and sexual activity.     Review of Systems   Constitutional: Negative for fatigue.   HENT: Negative for sore throat.    Eyes: Negative for pain.   Respiratory: Negative for cough, chest tightness and shortness of breath.    Cardiovascular: Negative for chest pain.   Gastrointestinal: Negative for abdominal pain, diarrhea and nausea.   Endocrine: Negative for polyuria.   Genitourinary: Negative for difficulty urinating, dysuria, flank pain and hematuria.   Musculoskeletal: Negative for arthralgias.   Skin: Negative for color change and rash.   Neurological: Negative for syncope and headaches.   Psychiatric/Behavioral: Negative for hallucinations.       Physical Exam   Physical Exam  Constitutional:       Appearance: He is well-developed.   HENT:      Head: Normocephalic and atraumatic.      Nose: Nose normal.      Mouth/Throat:      Mouth: Mucous membranes are moist.   Eyes:      Conjunctiva/sclera: Conjunctivae normal.   Cardiovascular:      Rate and Rhythm: Normal rate and regular rhythm.      Heart sounds: Normal heart sounds.   Pulmonary:      Effort: Pulmonary effort is normal.      Breath sounds: Normal breath sounds.   Abdominal:      General: Bowel sounds are normal.      Palpations: Abdomen is soft.      Tenderness: There is no abdominal tenderness. There is no guarding or rebound.   Musculoskeletal:         General: Normal range of motion.      Cervical back: Normal range of motion.   Skin:     General: Skin is warm and dry.      Capillary  Refill: Capillary refill takes less than 2 seconds.      Findings: No erythema or rash.   Neurological:      General: No focal deficit present.      Mental Status: He is alert and oriented to person, place, and time.   Psychiatric:         Mood and Affect: Mood normal.         Behavior: Behavior normal.       Medical Decision Making   54y/o M no pmhx presents for exposure to covid. He is asymptomatic. He is well-appearing with normal vital signs, no hypoxia or tachypnea concerning for bacterial PNA. Plan for covid test and discharge with isolation and supportive management.     Chart Review   Previous medical records requested.  Pertinent items reviewed:     ED Course      Laboratory Results   Labs Reviewed - No data to display    Imaging Results     No orders to display       Consults         Progress Notes / Reassessments              ED Procedure Notes       Any ED procedures performed are documented on separate ED procedure notes.    Clinical Impression     1. Viral URI        Disposition and Follow-up   Disposition: Discharge [1]       No future appointments.    Follow up with:  No follow-up provider specified.    Return precautions are specified on After Visit Summary.    There are no discharge medications for this patient.      Orders Placed This Encounter   ??? COVID-19 PCR, Nasopharyngeal   ??? Influenza A/B RSV PCR, Respiratory Upper   ??? Isolation Status: Add: Enhanced Droplet and Contact; Remove: N/A         Resident Signature            Claudette Laws., MD  Resident  09/20/20 [phone]

## 2020-09-21 LAB — Influenza A B RSV PCR: RSV PCR: NOT DETECTED

## 2023-05-09 ENCOUNTER — Ambulatory Visit: Payer: BLUE CROSS/BLUE SHIELD | Admitting: Dermatology

## 2023-11-30 ENCOUNTER — Ambulatory Visit: Payer: Self-pay

## 2023-11-30 DIAGNOSIS — K641 Second degree hemorrhoids: Secondary | ICD-10-CM | POA: Diagnosis not present

## 2023-11-30 DIAGNOSIS — Z83719 Family history of colon polyps, unspecified: Secondary | ICD-10-CM | POA: Diagnosis not present

## 2023-11-30 DIAGNOSIS — Z1211 Encounter for screening for malignant neoplasm of colon: Secondary | ICD-10-CM | POA: Diagnosis not present

## 2023-12-30 ENCOUNTER — Emergency Department

## 2023-12-30 ENCOUNTER — Emergency Department
Admission: EM | Admit: 2023-12-30 | Discharge: 2023-12-30 | Disposition: A | Discharge status: Home / Self Care | Attending: Emergency Medicine | Admitting: Emergency Medicine

## 2023-12-30 ENCOUNTER — Other Ambulatory Visit: Payer: Self-pay

## 2023-12-30 DIAGNOSIS — Y9367 Activity, basketball: Secondary | ICD-10-CM | POA: Insufficient documentation

## 2023-12-30 DIAGNOSIS — X509XXA Other and unspecified overexertion or strenuous movements or postures, initial encounter: Secondary | ICD-10-CM | POA: Insufficient documentation

## 2023-12-30 DIAGNOSIS — M25561 Pain in right knee: Secondary | ICD-10-CM | POA: Diagnosis present

## 2023-12-30 MED ORDER — MELOXICAM 15 MG PO TABS: 15.0000 mg | ORAL_TABLET | Freq: Every day | ORAL | 0 refills | Status: AC

## 2023-12-30 MED ORDER — HYDROCODONE-ACETAMINOPHEN 5-325 MG PO TABS: 1.0000 | ORAL_TABLET | ORAL | 0 refills | Status: DC | PRN

## 2023-12-30 NOTE — Discharge Instructions (Addendum)
 As we discussed please use your crutches to take some weight off of the right knee when walking if you are having pain.  Please take your meloxicam each day for the next 30 days to help with discomfort.  You have been prescribed hydrocodone to use only if needed for significant pain.  Do not drink alcohol or drive while taking hydrocodone.  Please call the number provided for orthopedics to arrange a follow-up appointment if you continue to have discomfort over the next 1 to 2 weeks for further evaluation and possible MRI.

## 2023-12-30 NOTE — ED Provider Notes (Signed)
   [facility] Provider Note    Event Date/Time   First MD Initiated Contact with Patient 12/30/23 1413     (approximate)  History   Chief Complaint: Knee Pain  HPI  Kohen Reither. is a 58 y.o. male with a past medical history of prior MI, hyperlipidemia, arthritis, presents to the emergency department for right knee pain.  According to the patient approximately 1 week ago he was playing basketball when he felt an acute pain in the right knee.  States the next day he felt like it "locked up" briefly.  States he has been doing well this week until this morning when he was at church sitting and tried to stand up and the knee "locked up" once again.  Patient came to the emergency department for evaluation.  Physical Exam   Triage Vital Signs: ED Triage Vitals  Encounter Vitals Group     BP 12/30/23 1357 117/70     Systolic BP Percentile --      Diastolic BP Percentile --      Pulse Rate 12/30/23 1357 63     Resp 12/30/23 1357 17     Temp 12/30/23 1357 98.2 F (36.8 C)     Temp Source 12/30/23 1357 Oral     SpO2 12/30/23 1357 97 %     Weight --      Height --      Head Circumference --      Peak Flow --      Pain Score 12/30/23 1401 10     Pain Loc --      Pain Education --      Exclude from Growth Chart --     Most recent vital signs: Vitals:   12/30/23 1357  BP: 117/70  Pulse: 63  Resp: 17  Temp: 98.2 F (36.8 C)  SpO2: 97%    General: Awake, no distress.  Other:  Patient has no palpable joint effusion.  Neurovascularly intact distally.  Good range of motion in the knee with no clicking or locking on my evaluation.  Vital signs reassuring.   ED Results / Procedures / Treatments   RADIOLOGY  I reviewed interpreted the x-ray images.  No fracture seen on my evaluation.  MEDICATIONS ORDERED IN ED: Medications - No data to display   IMPRESSION / MDM / ASSESSMENT AND PLAN / ED COURSE  I reviewed the triage vital signs and the nursing  notes.  Patient's presentation is most consistent with acute presentation with potential threat to life or bodily function.  Patient presents to the emergency department for right knee pain that locked up twice now in the last 1 week.  Patient states the pain for started while playing basketball.  Given that the knee has locked up twice I highly suspect a meniscal injury.  X-rays appear well.  No fracture or dislocation.  Moderate arthritic changes.  Discussed with the patient use of meloxicam as well as hydrocodone if needed for acute exacerbation of pain.  We will provide crutches with weightbearing as tolerated and have the patient follow-up with orthopedics for further evaluation and consideration of further imaging such as an MRI.  Patient agreeable to plan of care.  FINAL CLINICAL IMPRESSION(S) / ED DIAGNOSES   Right knee pain   Note:  This document was prepared using Dragon voice recognition software and may include unintentional dictation errors.   Minna Antis, MD 12/30/23 1501

## 2023-12-30 NOTE — ED Triage Notes (Signed)
 Pt comes with right knee pain. Pt states week ago he was playing basketball and may have injured it but he iced it and it was better. Pt states today he felt something pop while at [facility]. Pt states pain when trying to put weight on it.

## 2024-01-10 ENCOUNTER — Ambulatory Visit (HOSPITAL_BASED_OUTPATIENT_CLINIC_OR_DEPARTMENT_OTHER): Admitting: Student

## 2024-01-10 ENCOUNTER — Encounter (HOSPITAL_BASED_OUTPATIENT_CLINIC_OR_DEPARTMENT_OTHER): Payer: Self-pay | Admitting: Student

## 2024-01-10 DIAGNOSIS — M25561 Pain in right knee: Secondary | ICD-10-CM

## 2024-01-10 NOTE — Progress Notes (Signed)
 Chief Complaint: Right knee pain    Discussed the use of AI scribe software for clinical note transcription with the patient, who gave verbal consent to proceed.  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 The symptoms have been exacerbated by a recent basketball game, during which the patient felt something in his knee. Following the game, the patient experienced swelling and pain, which was severe enough to limit his mobility and require assistance for sitting and standing. The patient also reported an episode of the knee completely locking a few days ago, which led to an emergency room visit.  The patient has been using knee braces for support and received a cortisone shot, which provided significant relief. However, the patient still experiences limitations in knee mobility, particularly in bending the knee.   Surgical History:   None  PMH/PSH/Family History/Social History/Meds/Allergies:    Past Medical History:  Diagnosis Date   Hyperlipidemia    MI (myocardial infarction) (HCC)    Past Surgical History:  Procedure Laterality Date   CARDIAC CATHETERIZATION     Angioplasty with stent placement x 2   COLONOSCOPY WITH PROPOFOL  N/A 06/01/2017   Procedure: COLONOSCOPY WITH PROPOFOL ;  Surgeon: Luke Salaam, MD;  Location: [facility] ENDOSCOPY;  Service: Gastroenterology;  Laterality: N/A;   OTHER SURGICAL HISTORY     2 Cardiac stents   Social History   Socioeconomic History   Marital status: Married    Spouse name: Not on file   Number of children: Not on file   Years of education: Not on file   Highest education level: Not on file  Occupational History   Not on file  Tobacco Use   Smoking status: Never   Smokeless tobacco: Never  Substance and Sexual Activity   Alcohol use: No   Drug use: No   Sexual activity: Not on file  Other Topics Concern   Not on file   Social History Narrative   Not on file   Social Drivers of Health   Financial Resource Strain: Low Risk  (10/26/2023)   Received from [facility]   Overall Financial Resource Strain (CARDIA)    Difficulty of Paying Living Expenses: Not hard at all  Food Insecurity: No Food Insecurity (10/26/2023)   Received from [facility]   Hunger Vital Sign    Worried About Running Out of Food in the Last Year: Never true    Ran Out of Food in the Last Year: Never true  Transportation Needs: No Transportation Needs (10/26/2023)   Received from [facility] - Transportation    In the past 12 months, has lack of transportation kept you from medical appointments or from getting medications?: No    Lack of Transportation (Non-Medical): No  Physical Activity: Not on file  Stress: Not on file  Social Connections: Not on file   History reviewed. No pertinent family history. Allergies  Allergen Reactions   Oxycodone -Acetaminophen  Nausea And Vomiting   Current Outpatient Medications  Medication Sig Dispense Refill   HYDROcodone -acetaminophen  (NORCO/VICODIN) 5-325 MG tablet Take 1 tablet by mouth every 4 (four) hours as needed. 8 tablet 0   lisinopril (PRINIVIL,ZESTRIL) 20 MG tablet Take 20 mg by mouth daily.  meloxicam  (MOBIC ) 15 MG tablet Take 1 tablet (15 mg total) by mouth daily. 30 tablet 0   Metoprolol Succinate 25 MG CS24 Take 25 mg by mouth daily.     nitroGLYCERIN (NITROSTAT) 0.4 MG SL tablet Place under the tongue.     prasugrel (EFFIENT) 10 MG TABS tablet Take 10 mg by mouth daily.     pravastatin (PRAVACHOL) 10 MG tablet Take 10 mg by mouth daily.     No current facility-administered medications for this visit.   No results found.  Review of Systems:   A ROS was performed including pertinent positives and negatives as documented in the HPI.  Physical Exam :   Constitutional: NAD and appears stated age Neurological:  Alert and oriented Psych: Appropriate affect and cooperative There were no vitals taken for this visit.   Comprehensive Musculoskeletal Exam:    Exam of the right knee demonstrates tenderness to palpation along the lateral joint line.  Mild effusion present without overlying erythema or warmth.  Active range of motion from 0 to 110 degrees.  No laxity with varus or valgus stress.  Negative Lachman, anterior drawer, posterior drawer.  Positive McMurray.  Imaging:   Xray reviewed from 12/30/2023 (right knee 4 views): Mild tricompartmental degenerative changes, particularly with medial joint space narrowing and patellofemoral osteophytes.   I personally reviewed and interpreted the radiographs.      Assessment & Plan Right knee pain with suspected meniscus tear   Symptoms and locking episodes suggest a meniscus tear, with lateral joint line tenderness and positive McMurray. A recent cortisone injection has provided temporary relief. An MRI is needed for confirmation and treatment planning. Order an MRI of the right knee to particularly assess for a meniscus tear. Continue meloxicam  for inflammation. Advise limiting physical activity, especially avoiding strenuous activities like squats. Allow use of acetaminophen  for pain management if needed. Plan to review MRI results to determine further management.       I personally saw and evaluated the patient, and participated in the management and treatment plan.  Sharrell Deck, PA-C Orthopedics

## 2024-01-23 ENCOUNTER — Ambulatory Visit
Admission: RE | Admit: 2024-01-23 | Discharge: 2024-01-23 | Disposition: A | Discharge status: Home / Self Care | Source: Ambulatory Visit | Attending: Student | Admitting: Student

## 2024-01-23 DIAGNOSIS — M25561 Pain in right knee: Secondary | ICD-10-CM

## 2024-01-24 ENCOUNTER — Telehealth (HOSPITAL_BASED_OUTPATIENT_CLINIC_OR_DEPARTMENT_OTHER): Payer: Self-pay | Admitting: Orthopaedic Surgery

## 2024-01-24 NOTE — Telephone Encounter (Signed)
 Called patient and left a message to call  the office to get an appointment sch [phone] 

## 2024-01-24 NOTE — Telephone Encounter (Signed)
 Patient wife called and said her husband got his results for the MRI and wants to know if you have his results? Also she said that if you dont have the results she would fax them to you. She stated that Gregory Banks only seen Gregory Banks because he was the first avabilable and wants her her husband to get sch with Gregory Banks. I explained to her that I would see if it was ok to double be. Please advise . The best contact [phone]

## 2024-02-01 ENCOUNTER — Telehealth (HOSPITAL_BASED_OUTPATIENT_CLINIC_OR_DEPARTMENT_OTHER): Payer: Self-pay | Admitting: Orthopaedic Surgery

## 2024-02-01 ENCOUNTER — Ambulatory Visit (HOSPITAL_BASED_OUTPATIENT_CLINIC_OR_DEPARTMENT_OTHER): Admitting: Orthopaedic Surgery

## 2024-02-01 ENCOUNTER — Other Ambulatory Visit (HOSPITAL_BASED_OUTPATIENT_CLINIC_OR_DEPARTMENT_OTHER): Payer: Self-pay | Admitting: Orthopaedic Surgery

## 2024-02-01 DIAGNOSIS — S83271A Complex tear of lateral meniscus, current injury, right knee, initial encounter: Secondary | ICD-10-CM | POA: Diagnosis not present

## 2024-02-01 MED ORDER — IBUPROFEN 800 MG PO TABS: 800.0000 mg | ORAL_TABLET | Freq: Three times a day (TID) | ORAL | 0 refills | Status: AC

## 2024-02-01 MED ORDER — ASPIRIN 325 MG PO TBEC: 325.0000 mg | DELAYED_RELEASE_TABLET | Freq: Every day | ORAL | 0 refills | Status: AC

## 2024-02-01 MED ORDER — OXYCODONE HCL 5 MG PO CAPS
5.0000 mg | ORAL_CAPSULE | ORAL | 0 refills | Status: AC | PRN
Start: 2024-02-01 — End: ?

## 2024-02-01 MED ORDER — ACETAMINOPHEN 500 MG PO TABS: 500.0000 mg | ORAL_TABLET | Freq: Three times a day (TID) | ORAL | 0 refills | Status: AC

## 2024-02-01 MED ORDER — OXYCODONE HCL 5 MG PO TABS: 5.0000 mg | ORAL_TABLET | ORAL | 0 refills | Status: AC | PRN

## 2024-02-01 NOTE — Progress Notes (Signed)
 Chief Complaint: Right knee pain      02/01/2024: Presents today for follow-up and MRI discussion of his right knee.  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 The symptoms have been exacerbated by a recent basketball game, during which the patient felt something in his knee. Following the game, the patient experienced swelling and pain, which was severe enough to limit his mobility and require assistance for sitting and standing. The patient also reported an episode of the knee completely locking a few days ago, which led to an emergency room visit.  The patient has been using knee braces for support and received a cortisone shot, which provided significant relief. However, the patient still experiences limitations in knee mobility, particularly in bending the knee.     Surgical History:   None   PMH/PSH/Family History/Social History/Meds/Allergies:         Past Medical History:  Diagnosis Date   Hyperlipidemia     MI (myocardial infarction) (HCC)               Past Surgical History:  Procedure Laterality Date   CARDIAC CATHETERIZATION        Angioplasty with stent placement x 2   COLONOSCOPY WITH PROPOFOL  N/A 06/01/2017    Procedure: COLONOSCOPY WITH PROPOFOL ;  Surgeon: Luke Salaam, MD;  Location: [facility] ENDOSCOPY;  Service: Gastroenterology;  Laterality: N/A;   OTHER SURGICAL HISTORY        2 Cardiac stents        Social History         Socioeconomic History   Marital status: Married      Spouse name: Not on file   Number of children: Not on file   Years of education: Not on file   Highest education level: Not on file  Occupational History   Not on file  Tobacco Use   Smoking status: Never   Smokeless tobacco: Never  Substance and Sexual Activity   Alcohol use: No   Drug use: No   Sexual activity: Not on file  Other Topics Concern   Not on file  Social History  Narrative   Not on file    Social Drivers of Health        Financial Resource Strain: Low Risk  (10/26/2023)    Received from [facility]    Overall Financial Resource Strain (CARDIA)     Difficulty of Paying Living Expenses: Not hard at all  Food Insecurity: No Food Insecurity (10/26/2023)    Received from [facility]    Hunger Vital Sign     Worried About Running Out of Food in the Last Year: Never true     Ran Out of Food in the Last Year: Never true  Transportation Needs: No Transportation Needs (10/26/2023)    Received from [facility] - Transportation     In the past 12 months, has lack of transportation kept you from medical appointments or from getting medications?: No     Lack of Transportation (Non-Medical): No  Physical Activity: Not on file  Stress: Not on file  Social Connections: Not on file    History reviewed. No pertinent family history.     Allergies      Allergies  Allergen Reactions   Oxycodone -Acetaminophen  Nausea And Vomiting  Current Outpatient Medications  Medication Sig Dispense Refill   HYDROcodone -acetaminophen  (NORCO/VICODIN) 5-325 MG tablet Take 1 tablet by mouth every 4 (four) hours as needed. 8 tablet 0   lisinopril (PRINIVIL,ZESTRIL) 20 MG tablet Take 20 mg by mouth daily.       meloxicam  (MOBIC ) 15 MG tablet Take 1 tablet (15 mg total) by mouth daily. 30 tablet 0   Metoprolol Succinate 25 MG CS24 Take 25 mg by mouth daily.       nitroGLYCERIN (NITROSTAT) 0.4 MG SL tablet Place under the tongue.       prasugrel (EFFIENT) 10 MG TABS tablet Take 10 mg by mouth daily.       pravastatin (PRAVACHOL) 10 MG tablet Take 10 mg by mouth daily.          No current facility-administered medications for this visit.      Imaging Results (Last 48 hours)  No results found.     Review of Systems:   A ROS was performed including pertinent positives and negatives as documented in  the HPI.   Physical Exam :   Constitutional: NAD and appears stated age Neurological: Alert and oriented Psych: Appropriate affect and cooperative There were no vitals taken for this visit.    Comprehensive Musculoskeletal Exam:     Exam of the right knee demonstrates tenderness to palpation along the lateral joint line.  Mild effusion present without overlying erythema or warmth.  Active range of motion from 0 to 110 degrees.  No laxity with varus or valgus stress.  Negative Lachman, anterior drawer, posterior drawer.  Positive McMurray.   Imaging:   Xray reviewed from 12/30/2023 (right knee 4 views): Mild tricompartmental degenerative changes, particularly with medial joint space narrowing and patellofemoral osteophytes.   MRI right knee: Lateral meniscal tearing with extrusion   I personally reviewed and interpreted the radiographs.          Assessment & Plan  58 year old male with a right knee lateral meniscal tear and extrusion.  At this time he has been quite limited in activities with recurrent mechanical symptoms.  He has trialed bracing as well as injections without any relief.  Given this we did discuss the possibility of lateral meniscal repair with arthroscopy.  I discussed the risks and limitations associated with this but.  After discussion of risks and benefits he has elected to proceed  - Plan for right knee arthroscopy with lateral meniscal repair       After a lengthy discussion of treatment options, including risks, benefits, alternatives, complications of surgical and nonsurgical conservative options, the patient elected surgical repair.   The patient  is aware of the material risks  and complications including, but not limited to injury to adjacent structures, neurovascular injury, infection, numbness, bleeding, implant failure, thermal burns, stiffness, persistent pain, failure to heal, disease transmission from allograft, need for further surgery, dislocation,  anesthetic risks, blood clots, risks of death,and others. The probabilities of surgical success and failure discussed with patient given their particular co-morbidities.The time and nature of expected rehabilitation and recovery was discussed.The patient's questions were all answered preoperatively.  No barriers to understanding were noted. I explained the natural history of the disease process and Rx rationale.  I explained to the patient what I considered to be reasonable expectations given their personal situation.  The final treatment plan was arrived at through a shared patient decision making process model.        I personally saw and evaluated the patient, and  participated in the management and treatment plan.

## 2024-02-01 NOTE — Addendum Note (Signed)
 Addended by: Albesa Huguenin on: 02/01/2024 11:32 AM   Modules accepted: Orders

## 2024-02-01 NOTE — Telephone Encounter (Signed)
Surgery info

## 2024-02-20 ENCOUNTER — Encounter (HOSPITAL_BASED_OUTPATIENT_CLINIC_OR_DEPARTMENT_OTHER): Payer: Self-pay | Admitting: Orthopaedic Surgery

## 2024-02-20 ENCOUNTER — Other Ambulatory Visit: Payer: Self-pay

## 2024-02-21 ENCOUNTER — Encounter (HOSPITAL_BASED_OUTPATIENT_CLINIC_OR_DEPARTMENT_OTHER)
Admission: RE | Admit: 2024-02-21 | Discharge: 2024-02-21 | Disposition: A | Discharge status: Home / Self Care | Source: Ambulatory Visit | Attending: Orthopaedic Surgery | Admitting: Orthopaedic Surgery

## 2024-02-21 ENCOUNTER — Other Ambulatory Visit: Payer: Self-pay

## 2024-02-21 DIAGNOSIS — I10 Essential (primary) hypertension: Secondary | ICD-10-CM | POA: Insufficient documentation

## 2024-02-21 DIAGNOSIS — R9431 Abnormal electrocardiogram [ECG] [EKG]: Secondary | ICD-10-CM | POA: Insufficient documentation

## 2024-02-21 DIAGNOSIS — I219 Acute myocardial infarction, unspecified: Secondary | ICD-10-CM | POA: Diagnosis not present

## 2024-02-21 DIAGNOSIS — Z01810 Encounter for preprocedural cardiovascular examination: Secondary | ICD-10-CM | POA: Diagnosis not present

## 2024-02-21 DIAGNOSIS — Z01818 Encounter for other preprocedural examination: Secondary | ICD-10-CM | POA: Diagnosis present

## 2024-02-21 NOTE — Progress Notes (Signed)

## 2024-02-22 ENCOUNTER — Ambulatory Visit (HOSPITAL_BASED_OUTPATIENT_CLINIC_OR_DEPARTMENT_OTHER): Payer: Self-pay | Admitting: Orthopaedic Surgery

## 2024-02-22 DIAGNOSIS — S83271A Complex tear of lateral meniscus, current injury, right knee, initial encounter: Secondary | ICD-10-CM

## 2024-02-26 ENCOUNTER — Other Ambulatory Visit: Payer: Self-pay

## 2024-02-26 ENCOUNTER — Encounter (HOSPITAL_BASED_OUTPATIENT_CLINIC_OR_DEPARTMENT_OTHER)
Admission: RE | Disposition: A | Discharge status: Home / Self Care | Payer: Self-pay | Source: Home / Self Care | Attending: Orthopaedic Surgery

## 2024-02-26 ENCOUNTER — Encounter (HOSPITAL_BASED_OUTPATIENT_CLINIC_OR_DEPARTMENT_OTHER): Payer: Self-pay | Admitting: Orthopaedic Surgery

## 2024-02-26 ENCOUNTER — Ambulatory Visit (HOSPITAL_BASED_OUTPATIENT_CLINIC_OR_DEPARTMENT_OTHER): Admitting: Anesthesiology

## 2024-02-26 ENCOUNTER — Ambulatory Visit (HOSPITAL_BASED_OUTPATIENT_CLINIC_OR_DEPARTMENT_OTHER)
Admission: RE | Admit: 2024-02-26 | Discharge: 2024-02-26 | Disposition: A | Discharge status: Home / Self Care | Attending: Orthopaedic Surgery | Admitting: Orthopaedic Surgery

## 2024-02-26 DIAGNOSIS — I110 Hypertensive heart disease with heart failure: Secondary | ICD-10-CM | POA: Insufficient documentation

## 2024-02-26 DIAGNOSIS — I252 Old myocardial infarction: Secondary | ICD-10-CM | POA: Diagnosis not present

## 2024-02-26 DIAGNOSIS — I10 Essential (primary) hypertension: Secondary | ICD-10-CM

## 2024-02-26 DIAGNOSIS — I2510 Atherosclerotic heart disease of native coronary artery without angina pectoris: Secondary | ICD-10-CM | POA: Insufficient documentation

## 2024-02-26 DIAGNOSIS — S83281D Other tear of lateral meniscus, current injury, right knee, subsequent encounter: Secondary | ICD-10-CM

## 2024-02-26 DIAGNOSIS — S83281A Other tear of lateral meniscus, current injury, right knee, initial encounter: Secondary | ICD-10-CM | POA: Diagnosis present

## 2024-02-26 DIAGNOSIS — X58XXXA Exposure to other specified factors, initial encounter: Secondary | ICD-10-CM | POA: Insufficient documentation

## 2024-02-26 DIAGNOSIS — I2102 ST elevation (STEMI) myocardial infarction involving left anterior descending coronary artery: Secondary | ICD-10-CM

## 2024-02-26 DIAGNOSIS — I509 Heart failure, unspecified: Secondary | ICD-10-CM | POA: Insufficient documentation

## 2024-02-26 DIAGNOSIS — S83271A Complex tear of lateral meniscus, current injury, right knee, initial encounter: Secondary | ICD-10-CM

## 2024-02-26 DIAGNOSIS — Z955 Presence of coronary angioplasty implant and graft: Secondary | ICD-10-CM | POA: Insufficient documentation

## 2024-02-26 HISTORY — DX: Unspecified osteoarthritis, unspecified site: M19.90

## 2024-02-26 HISTORY — PX: KNEE ARTHROSCOPY WITH MENISCAL REPAIR: SHX5653

## 2024-02-26 HISTORY — DX: Essential (primary) hypertension: I10

## 2024-02-26 SURGERY — ARTHROSCOPY, KNEE, WITH MENISCUS REPAIR
Anesthesia: General | Site: Knee | Laterality: Right

## 2024-02-26 MED ORDER — CEFAZOLIN SODIUM-DEXTROSE 3-4 GM/150ML-% IV SOLN
INTRAVENOUS | Status: AC
  Filled 2024-02-26: qty 150

## 2024-02-26 MED ORDER — GLYCOPYRROLATE PF 0.2 MG/ML IJ SOSY
PREFILLED_SYRINGE | INTRAMUSCULAR | Status: DC | PRN
  Administered 2024-02-26: .2 mg via INTRAVENOUS

## 2024-02-26 MED ORDER — ACETAMINOPHEN 500 MG PO TABS
ORAL_TABLET | ORAL | Status: AC
Start: 2024-02-26 — End: ?
  Filled 2024-02-26: qty 2

## 2024-02-26 MED ORDER — HYDROMORPHONE HCL 1 MG/ML IJ SOLN: 0.2500 mg | INTRAMUSCULAR | Status: DC | PRN

## 2024-02-26 MED ORDER — DEXAMETHASONE SODIUM PHOSPHATE 10 MG/ML IJ SOLN
INTRAMUSCULAR | Status: AC
  Filled 2024-02-26: qty 1

## 2024-02-26 MED ORDER — ONDANSETRON HCL 4 MG/2ML IJ SOLN
INTRAMUSCULAR | Status: AC
Start: 2024-02-26 — End: ?
  Filled 2024-02-26: qty 2

## 2024-02-26 MED ORDER — EPHEDRINE 5 MG/ML INJ
INTRAVENOUS | Status: AC
Start: 2024-02-26 — End: ?
  Filled 2024-02-26: qty 5

## 2024-02-26 MED ORDER — LIDOCAINE 2% (20 MG/ML) 5 ML SYRINGE
INTRAMUSCULAR | Status: AC
  Filled 2024-02-26: qty 5

## 2024-02-26 MED ORDER — LACTATED RINGERS IV SOLN: INTRAVENOUS | Status: DC

## 2024-02-26 MED ORDER — TRANEXAMIC ACID-NACL 1000-0.7 MG/100ML-% IV SOLN
INTRAVENOUS | Status: AC
  Filled 2024-02-26: qty 100

## 2024-02-26 MED ORDER — ACETAMINOPHEN 500 MG PO TABS: 1000.0000 mg | ORAL_TABLET | Freq: Once | ORAL | Status: DC

## 2024-02-26 MED ORDER — EPHEDRINE SULFATE-NACL 50-0.9 MG/10ML-% IV SOSY
PREFILLED_SYRINGE | INTRAVENOUS | Status: DC | PRN
  Administered 2024-02-26 (×2): 5 mg via INTRAVENOUS

## 2024-02-26 MED ORDER — LIDOCAINE 2% (20 MG/ML) 5 ML SYRINGE
INTRAMUSCULAR | Status: DC | PRN
  Administered 2024-02-26: 100 mg via INTRAVENOUS

## 2024-02-26 MED ORDER — DROPERIDOL 2.5 MG/ML IJ SOLN: 0.6250 mg | Freq: Once | INTRAMUSCULAR | Status: DC | PRN

## 2024-02-26 MED ORDER — GABAPENTIN 300 MG PO CAPS
ORAL_CAPSULE | ORAL | Status: AC
  Filled 2024-02-26: qty 1

## 2024-02-26 MED ORDER — ACETAMINOPHEN 500 MG PO TABS
1000.0000 mg | ORAL_TABLET | Freq: Once | ORAL | Status: AC
  Administered 2024-02-26: 1000 mg via ORAL

## 2024-02-26 MED ORDER — PHENYLEPHRINE 80 MCG/ML (10ML) SYRINGE FOR IV PUSH (FOR BLOOD PRESSURE SUPPORT)
PREFILLED_SYRINGE | INTRAVENOUS | Status: AC
  Filled 2024-02-26: qty 20

## 2024-02-26 MED ORDER — PROPOFOL 10 MG/ML IV BOLUS
INTRAVENOUS | Status: AC
  Filled 2024-02-26: qty 20

## 2024-02-26 MED ORDER — TRANEXAMIC ACID-NACL 1000-0.7 MG/100ML-% IV SOLN
1000.0000 mg | INTRAVENOUS | Status: AC
  Administered 2024-02-26: 1000 mg via INTRAVENOUS

## 2024-02-26 MED ORDER — GABAPENTIN 300 MG PO CAPS
300.0000 mg | ORAL_CAPSULE | Freq: Once | ORAL | Status: AC
  Administered 2024-02-26: 300 mg via ORAL

## 2024-02-26 MED ORDER — PHENYLEPHRINE 80 MCG/ML (10ML) SYRINGE FOR IV PUSH (FOR BLOOD PRESSURE SUPPORT)
PREFILLED_SYRINGE | INTRAVENOUS | Status: DC | PRN
  Administered 2024-02-26 (×3): 160 ug via INTRAVENOUS
  Administered 2024-02-26: 80 ug via INTRAVENOUS
  Administered 2024-02-26: 160 ug via INTRAVENOUS

## 2024-02-26 MED ORDER — FENTANYL CITRATE (PF) 100 MCG/2ML IJ SOLN
INTRAMUSCULAR | Status: AC
  Filled 2024-02-26: qty 2

## 2024-02-26 MED ORDER — KETOROLAC TROMETHAMINE 30 MG/ML IJ SOLN
INTRAMUSCULAR | Status: AC
Start: 2024-02-26 — End: ?
  Filled 2024-02-26: qty 1

## 2024-02-26 MED ORDER — BUPIVACAINE HCL (PF) 0.25 % IJ SOLN
INTRAMUSCULAR | Status: DC | PRN
  Administered 2024-02-26: 20 mL

## 2024-02-26 MED ORDER — CEFAZOLIN SODIUM-DEXTROSE 3-4 GM/150ML-% IV SOLN
INTRAVENOUS | Status: AC
Start: 2024-02-26 — End: ?
  Filled 2024-02-26: qty 150

## 2024-02-26 MED ORDER — MIDAZOLAM HCL 2 MG/2ML IJ SOLN
INTRAMUSCULAR | Status: AC
Start: 2024-02-26 — End: ?
  Filled 2024-02-26: qty 2

## 2024-02-26 MED ORDER — MIDAZOLAM HCL 5 MG/5ML IJ SOLN
INTRAMUSCULAR | Status: DC | PRN
  Administered 2024-02-26: 2 mg via INTRAVENOUS

## 2024-02-26 MED ORDER — ONDANSETRON HCL 4 MG/2ML IJ SOLN
INTRAMUSCULAR | Status: DC | PRN
Start: 2024-02-26 — End: 2024-02-26
  Administered 2024-02-26: 4 mg via INTRAVENOUS

## 2024-02-26 MED ORDER — SODIUM CHLORIDE 0.9 % IR SOLN
Status: DC | PRN
  Administered 2024-02-26: 6000 mL

## 2024-02-26 MED ORDER — LACTATED RINGERS IV SOLN: INTRAVENOUS | Status: DC | PRN

## 2024-02-26 MED ORDER — PROPOFOL 10 MG/ML IV BOLUS
INTRAVENOUS | Status: DC | PRN
  Administered 2024-02-26: 200 mg via INTRAVENOUS
  Administered 2024-02-26: 50 mg via INTRAVENOUS

## 2024-02-26 MED ORDER — GABAPENTIN 300 MG PO CAPS: 300.0000 mg | ORAL_CAPSULE | Freq: Once | ORAL | Status: DC

## 2024-02-26 MED ORDER — KETOROLAC TROMETHAMINE 30 MG/ML IJ SOLN
INTRAMUSCULAR | Status: DC | PRN
  Administered 2024-02-26: 30 mg via INTRAVENOUS

## 2024-02-26 MED ORDER — CEFAZOLIN SODIUM-DEXTROSE 3-4 GM/150ML-% IV SOLN
3.0000 g | INTRAVENOUS | Status: AC
  Administered 2024-02-26: 3 g via INTRAVENOUS

## 2024-02-26 MED ORDER — DEXAMETHASONE SODIUM PHOSPHATE 10 MG/ML IJ SOLN
INTRAMUSCULAR | Status: DC | PRN
  Administered 2024-02-26: 10 mg via INTRAVENOUS

## 2024-02-26 MED ORDER — FENTANYL CITRATE (PF) 100 MCG/2ML IJ SOLN
INTRAMUSCULAR | Status: DC | PRN
  Administered 2024-02-26: 75 ug via INTRAVENOUS
  Administered 2024-02-26: 25 ug via INTRAVENOUS

## 2024-02-26 SURGICAL SUPPLY — 47 items
ANCHOR SUT 1.8 FIBERTAK SB KL (Anchor) IMPLANT
BLADE EXCALIBUR 4.0X13 (MISCELLANEOUS) IMPLANT
BLADE SURG 15 STRL LF DISP TIS (BLADE) IMPLANT
BNDG ELASTIC 4INX 5YD STR LF (GAUZE/BANDAGES/DRESSINGS) ×1 IMPLANT
BNDG ELASTIC 6INX 5YD STR LF (GAUZE/BANDAGES/DRESSINGS) ×1 IMPLANT
BURR OVAL 8 FLU 4.0X13 (MISCELLANEOUS) IMPLANT
CHLORAPREP W/TINT 26 (MISCELLANEOUS) ×1 IMPLANT
COOLER ICEMAN CLASSIC (MISCELLANEOUS) ×1 IMPLANT
DISSECTOR 3.8MM X 13CM (MISCELLANEOUS) ×1 IMPLANT
DRAPE IMP U-DRAPE 54X76 (DRAPES) IMPLANT
DRAPE INCISE IOBAN 66X45 STRL (DRAPES) IMPLANT
DRAPE U-SHAPE 47X51 STRL (DRAPES) ×1 IMPLANT
DRAPE-T ARTHROSCOPY W/POUCH (DRAPES) ×1 IMPLANT
DW OUTFLOW CASSETTE/TUBE SET (MISCELLANEOUS) IMPLANT
ELECTRODE REM PT RTRN 9FT ADLT (ELECTROSURGICAL) IMPLANT
EXCALIBUR 3.8MM X 13CM (MISCELLANEOUS) IMPLANT
GAUZE 4X4 16PLY ~~LOC~~+RFID DBL (SPONGE) IMPLANT
GAUZE PAD ABD 8X10 STRL (GAUZE/BANDAGES/DRESSINGS) ×1 IMPLANT
GAUZE SPONGE 4X4 12PLY STRL (GAUZE/BANDAGES/DRESSINGS) ×1 IMPLANT
GAUZE XEROFORM 1X8 LF (GAUZE/BANDAGES/DRESSINGS) ×1 IMPLANT
GLOVE BIO SURGEON STRL SZ 6 (GLOVE) ×1 IMPLANT
GLOVE BIO SURGEON STRL SZ7.5 (GLOVE) ×1 IMPLANT
GLOVE BIOGEL PI IND STRL 6.5 (GLOVE) ×1 IMPLANT
GLOVE BIOGEL PI IND STRL 8 (GLOVE) ×1 IMPLANT
GOWN STRL REUS W/ TWL LRG LVL3 (GOWN DISPOSABLE) ×2 IMPLANT
GOWN STRL REUS W/ TWL XL LVL3 (GOWN DISPOSABLE) ×1 IMPLANT
KIT STR SPEAR 1.8 FBRTK DISP (KITS) IMPLANT
LASSO 90 CVE QUICKPAS (DISPOSABLE) IMPLANT
LOOP 2 FIBERLINK CLOSED (SUTURE) IMPLANT
MANIFOLD NEPTUNE II (INSTRUMENTS) ×1 IMPLANT
NDL SAFETY ECLIPSE 18X1.5 (NEEDLE) ×1 IMPLANT
NDL SUT 2-0 SCORPION KNEE (NEEDLE) IMPLANT
NEEDLE SUT 2-0 SCORPION KNEE (NEEDLE) IMPLANT
PACK ARTHROSCOPY DSU (CUSTOM PROCEDURE TRAY) ×1 IMPLANT
PACK BASIN DAY SURGERY FS (CUSTOM PROCEDURE TRAY) ×1 IMPLANT
PAD COLD SHLDR WRAP-ON (PAD) ×1 IMPLANT
PADDING CAST COTTON 6X4 STRL (CAST SUPPLIES) ×1 IMPLANT
PENCIL SMOKE EVACUATOR (MISCELLANEOUS) IMPLANT
PICK POWER XL 45DEG (MISCELLANEOUS) IMPLANT
SLEEVE SCD COMPRESS KNEE MED (STOCKING) ×1 IMPLANT
SUCTION TUBE FRAZIER 10FR DISP (SUCTIONS) ×1 IMPLANT
SUT ETHILON 3 0 PS 1 (SUTURE) ×1 IMPLANT
SUT VIC AB 0 CT1 27XBRD ANBCTR (SUTURE) IMPLANT
SUT VIC AB 2-0 CT1 TAPERPNT 27 (SUTURE) IMPLANT
TOWEL GREEN STERILE FF (TOWEL DISPOSABLE) ×2 IMPLANT
TUBING ARTHROSCOPY IRRIG 16FT (MISCELLANEOUS) ×1 IMPLANT
WAND ABLATOR APOLLO I90 (BUR) ×1 IMPLANT

## 2024-02-26 NOTE — Discharge Instructions (Addendum)
 Discharge Instructions    Attending Surgeon: Wilhelmenia Harada, MD Office Phone Number: [phone]   Diagnosis and Procedures:    Surgeries Performed: Meniscal repair right knee  Discharge Plan:    Diet: Resume usual diet. Begin with light or bland foods.  Drink plenty of fluids.  Activity:  Weight bearing as tolerated right knee. You are advised to go home directly from the hospital or surgical center. Restrict your activities.  GENERAL INSTRUCTIONS: 1.  Please apply ice to your wound to help with swelling and inflammation. This will improve your comfort and your overall recovery following surgery.     2. Please call Dr. Verline Glow office at [phone] with questions Monday-Friday during business hours. If no one answers, please leave a message and someone should get back to the patient within 24 hours. For emergencies please call 911 or proceed to the emergency room.   3. Patient to notify surgical team if experiences any of the following: Bowel/Bladder dysfunction, uncontrolled pain, nerve/muscle weakness, incision with increased drainage or redness, nausea/vomiting and Fever greater than 101.0 F.  Be alert for signs of infection including redness, streaking, odor, fever or chills. Be alert for excessive pain or bleeding and notify your surgeon immediately.  WOUND INSTRUCTIONS:   Leave your dressing, cast, or splint in place until your post operative visit.  Keep it clean and dry.  Always keep the incision clean and dry until the staples/sutures are removed. If there is no drainage from the incision you should keep it open to air. If there is drainage from the incision you must keep it covered at all times until the drainage stops  Do not soak in a bath tub, hot tub, pool, lake or other body of water until 21 days after your surgery and your incision is completely dry and healed.  If you have removable sutures (or staples) they must be removed 10-14 days (unless  otherwise instructed) from the day of your surgery.     1)  Elevate the extremity as much as possible.  2)  Keep the dressing clean and dry.  3)  Please call us  if the dressing becomes wet or dirty.  4)  If you are experiencing worsening pain or worsening swelling, please call.     MEDICATIONS: Resume all previous home medications at the previous prescribed dose and frequency unless otherwise noted Start taking the  pain medications on an as-needed basis as prescribed  Please taper down pain medication over the next week following surgery.  Ideally you should not require a refill of any narcotic pain medication.  Take pain medication with food to minimize nausea. In addition to the prescribed pain medication, you may take over-the-counter pain relievers such as Tylenol .  Do NOT take additional tylenol  if your pain medication already has tylenol  in it.  Aspirin  325mg  daily per instructions on bottle. Narcotic policy: Per [facility] clinic policy, our goal is ensure optimal postoperative pain control with a multimodal pain management strategy. For all [facility] patients, our goal is to wean post-operative narcotic medications by 6 weeks post-operatively, and many times sooner. If this is not possible due to utilization of pain medication prior to surgery, your [facility] doctor will support your acute post-operative pain control for the first 6 weeks postoperatively, with a plan to transition you back to your primary pain team following that. Max Spain will work to ensure a Therapist, occupational.       FOLLOWUP INSTRUCTIONS: 1. Follow up at the Physical Therapy  Clinic 3-4 days following surgery. This appointment should be scheduled unless other arrangements have been made.The Physical Therapy scheduling number is [phone] if an appointment has not already been arranged.  2. Contact Dr. Verline Glow office during office hours at [phone] or the practice after hours line at [phone] for  non-emergencies. For medical emergencies call 911.   Discharge Location: Home  No Tylenol  until 4:09pm today, if needed.

## 2024-02-26 NOTE — Op Note (Signed)
 Date of Surgery: 02/26/2024  INDICATIONS: Gregory Banks is a 58 y.o.-year-old male with right knee lateral meniscal tear.  The risk and benefits of the procedure were discussed in detail and documented in the pre-operative evaluation.   PREOPERATIVE DIAGNOSIS: 1.  Right knee lateral meniscal tear  POSTOPERATIVE DIAGNOSIS: Same.  PROCEDURE: 1.  Right knee arthroscopy with knee lateral meniscal repair  SURGEON: Carmina Chris MD  ASSISTANT: Deon Flatter, ATC  ANESTHESIA:  general  IV FLUIDS AND URINE: See anesthesia record.  ANTIBIOTICS: Ancef  ESTIMATED BLOOD LOSS: 5 mL.  IMPLANTS:  Implant Name Type Inv. Item Serial No. Manufacturer Lot No. LRB No. Used Action  ANCHOR SUT 1.8 FIBERTAK SB KL - [id number] Anchor ANCHOR SUT 1.8 FIBERTAK SB KL  [employer] [id number] Right 1 Implanted    DRAINS: None  CULTURES: None  COMPLICATIONS: none  DESCRIPTION OF PROCEDURE:   Examination under anesthesia: A careful examination under anesthesia was performed.  Knee ROM motion was: -3 - 135 Lachman: Normal Pivot Shift: Normal Posterior drawer: normal.   Varus stability in full extension: normal.   Varus stability in 30 degrees of flexion: normal.  Valgus stability in full extension: normal.   Valgus stability in 30 degrees of flexion: normal.  Posterolateral drawer: normal   Intra-operative findings: A thorough arthroscopic examination of the knee was performed.  The findings are: 1. Suprapatellar pouch: Normal 2. Undersurface of median ridge: Normal 3. Medial patellar facet: Normal 4. Lateral patellar facet: Grade 3 chondral loss 5. Trochlea: Normal 6. Lateral gutter/popliteus tendon: Normal 7. Hoffa's fat pad: Normal 8. Medial gutter/plica: Normal 9. ACL: Normal 10. PCL: Normal 11. Medial meniscus: Normal 12. Medial compartment cartilage: Normal 13. Lateral meniscus: Central body tearing with lateral extrusion 14. Lateral compartment cartilage: 4 chondral loss just lateral  to the tibial spine but relatively spared remaining joint.  Hypertrophy lateral tibial spine   I identified the patient in the pre-operative holding area.  I marked the operative knee with my initials. I reviewed the risks and benefits of the proposed surgical intervention and the patient wished to proceed.  Anesthesia performed a peripheral nerve block.  Patient was subsequently taken back to the operating room.  The patient was transferred to the operative suite and placed in the supine position with all bony prominences padded.     SCDs were placed on the non-operative lower extremity. Appropriate antibiotics was administered within 1 hour before incision. The operative lower extremity was then prepped and draped in standard fashion. A time out was performed confirming the correct extremity, correct patient and correct procedure.    A standard anterolateral portal was made with an 11 blade.  The ideal position for the anteromedial portal was established using a spinal needle.  This AM portal was then created under direct visualization with an 11 blade.  A full diagnostic arthroscopy was then performed, as described above, including probing of the chondral and meniscal surfaces.     At this time the lateral meniscal repair was performed.  A fiber tack anchor was introduced just medial to the joint line.  Meniscal lasso was used to wrap around this and the passing suture was then wrapped around the meniscus.  This was placed into the knotless mechanism with excellent restoration of the meniscus within the joint line.  At this time he did have a severely hypertrophied lateral tibial spine which was causing irritation of the lateral femoral condyle.  This was burred down.  The residual bed of  the lateral femoral condyle and lateral tibial spine was drilled with the power pick in order to encourage chondral formation.  That concluded the case.  Skin was closed with 2-0 Vicryl and 3-0 nylon. Xeroform gauze,  gauze, Tegaderm, Iceman and brace were applied.  Instrument, sponge, and needle counts were correct prior to wound closure and at the conclusion of the case.  The patient was taken to the PACU without complication   POSTOPERATIVE PLAN: He will be weightbearing as tolerated on the right knee.  I will see him back in 2 weeks for suture removal.  He was placed on aspirin  for blood clot prevention  Carmina Chris, MD 1:22 PM

## 2024-02-26 NOTE — Anesthesia Postprocedure Evaluation (Signed)
 Anesthesia Post Note  Patient: Gregory Banks.  Procedure(s) Performed: ARTHROSCOPY, KNEE, WITH MENISCUS REPAIR (Right: Knee)     Patient location during evaluation: PACU Anesthesia Type: General Level of consciousness: sedated and patient cooperative Pain management: pain level controlled Vital Signs Assessment: post-procedure vital signs reviewed and stable Respiratory status: spontaneous breathing Cardiovascular status: stable Anesthetic complications: no  No notable events documented.  Last Vitals:  Vitals:   02/26/24 1415 02/26/24 1425  BP: (!) 139/90 (!) 142/90  Pulse: (!) 54 (!) 50  Resp: 16 18  Temp:  (!) 36.2 C  SpO2: 98% 98%    Last Pain:  Vitals:   02/26/24 1425  TempSrc:   PainSc: 0-No pain                 Gorman Laughter

## 2024-02-26 NOTE — H&P (Signed)
 Chief Complaint: Right knee pain      02/01/2024: Presents today for follow-up and MRI discussion of his right knee.  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 The symptoms have been exacerbated by a recent basketball game, during which the patient felt something in his knee. Following the game, the patient experienced swelling and pain, which was severe enough to limit his mobility and require assistance for sitting and standing. The patient also reported an episode of the knee completely locking a few days ago, which led to an emergency room visit.  The patient has been using knee braces for support and received a cortisone shot, which provided significant relief. However, the patient still experiences limitations in knee mobility, particularly in bending the knee.     Surgical History:   None   PMH/PSH/Family History/Social History/Meds/Allergies:         Past Medical History:  Diagnosis Date   Hyperlipidemia     MI (myocardial infarction) (HCC)               Past Surgical History:  Procedure Laterality Date   CARDIAC CATHETERIZATION        Angioplasty with stent placement x 2   COLONOSCOPY WITH PROPOFOL  N/A 06/01/2017    Procedure: COLONOSCOPY WITH PROPOFOL ;  Surgeon: Luke Salaam, MD;  Location: [facility] ENDOSCOPY;  Service: Gastroenterology;  Laterality: N/A;   OTHER SURGICAL HISTORY        2 Cardiac stents        Social History         Socioeconomic History   Marital status: Married      Spouse name: Not on file   Number of children: Not on file   Years of education: Not on file   Highest education level: Not on file  Occupational History   Not on file  Tobacco Use   Smoking status: Never   Smokeless tobacco: Never  Substance and Sexual Activity   Alcohol use: No   Drug use: No   Sexual activity: Not on file  Other Topics Concern   Not on file  Social History  Narrative   Not on file    Social Drivers of Health        Financial Resource Strain: Low Risk  (10/26/2023)    Received from [facility]    Overall Financial Resource Strain (CARDIA)     Difficulty of Paying Living Expenses: Not hard at all  Food Insecurity: No Food Insecurity (10/26/2023)    Received from [facility]    Hunger Vital Sign     Worried About Running Out of Food in the Last Year: Never true     Ran Out of Food in the Last Year: Never true  Transportation Needs: No Transportation Needs (10/26/2023)    Received from [facility] - Transportation     In the past 12 months, has lack of transportation kept you from medical appointments or from getting medications?: No     Lack of Transportation (Non-Medical): No  Physical Activity: Not on file  Stress: Not on file  Social Connections: Not on file    History reviewed. No pertinent family history.     Allergies      Allergies  Allergen Reactions   Oxycodone -Acetaminophen  Nausea And Vomiting  Current Outpatient Medications  Medication Sig Dispense Refill   HYDROcodone -acetaminophen  (NORCO/VICODIN) 5-325 MG tablet Take 1 tablet by mouth every 4 (four) hours as needed. 8 tablet 0   lisinopril (PRINIVIL,ZESTRIL) 20 MG tablet Take 20 mg by mouth daily.       meloxicam  (MOBIC ) 15 MG tablet Take 1 tablet (15 mg total) by mouth daily. 30 tablet 0   Metoprolol Succinate 25 MG CS24 Take 25 mg by mouth daily.       nitroGLYCERIN (NITROSTAT) 0.4 MG SL tablet Place under the tongue.       prasugrel (EFFIENT) 10 MG TABS tablet Take 10 mg by mouth daily.       pravastatin (PRAVACHOL) 10 MG tablet Take 10 mg by mouth daily.          No current facility-administered medications for this visit.      Imaging Results (Last 48 hours)  No results found.     Review of Systems:   A ROS was performed including pertinent positives and negatives as documented in  the HPI.   Physical Exam :   Constitutional: NAD and appears stated age Neurological: Alert and oriented Psych: Appropriate affect and cooperative There were no vitals taken for this visit.    Comprehensive Musculoskeletal Exam:     Exam of the right knee demonstrates tenderness to palpation along the lateral joint line.  Mild effusion present without overlying erythema or warmth.  Active range of motion from 0 to 110 degrees.  No laxity with varus or valgus stress.  Negative Lachman, anterior drawer, posterior drawer.  Positive McMurray.   Imaging:   Xray reviewed from 12/30/2023 (right knee 4 views): Mild tricompartmental degenerative changes, particularly with medial joint space narrowing and patellofemoral osteophytes.   MRI right knee: Lateral meniscal tearing with extrusion   I personally reviewed and interpreted the radiographs.          Assessment & Plan  58 year old male with a right knee lateral meniscal tear and extrusion.  At this time he has been quite limited in activities with recurrent mechanical symptoms.  He has trialed bracing as well as injections without any relief.  Given this we did discuss the possibility of lateral meniscal repair with arthroscopy.  I discussed the risks and limitations associated with this but.  After discussion of risks and benefits he has elected to proceed  - Plan for right knee arthroscopy with lateral meniscal repair       After a lengthy discussion of treatment options, including risks, benefits, alternatives, complications of surgical and nonsurgical conservative options, the patient elected surgical repair.   The patient  is aware of the material risks  and complications including, but not limited to injury to adjacent structures, neurovascular injury, infection, numbness, bleeding, implant failure, thermal burns, stiffness, persistent pain, failure to heal, disease transmission from allograft, need for further surgery, dislocation,  anesthetic risks, blood clots, risks of death,and others. The probabilities of surgical success and failure discussed with patient given their particular co-morbidities.The time and nature of expected rehabilitation and recovery was discussed.The patient's questions were all answered preoperatively.  No barriers to understanding were noted. I explained the natural history of the disease process and Rx rationale.  I explained to the patient what I considered to be reasonable expectations given their personal situation.  The final treatment plan was arrived at through a shared patient decision making process model.        I personally saw and evaluated the patient, and  participated in the management and treatment plan.

## 2024-02-26 NOTE — Transfer of Care (Signed)
 Immediate Anesthesia Transfer of Care Note  Patient: Gregory Banks.  Procedure(s) Performed: ARTHROSCOPY, KNEE, WITH MENISCUS REPAIR (Right: Knee)  Patient Location: PACU  Anesthesia Type:General  Level of Consciousness: drowsy and patient cooperative  Airway & Oxygen Therapy: Patient Spontanous Breathing and Patient connected to face mask oxygen  Post-op Assessment: Report given to RN and Post -op Vital signs reviewed and stable  Post vital signs: Reviewed and stable  Last Vitals:  Vitals Value Taken Time  BP 129/76 02/26/24 1336  Temp    Pulse 54 02/26/24 1339  Resp 11 02/26/24 1339  SpO2 100 % 02/26/24 1339  Vitals shown include unfiled device data.  Last Pain:  Vitals:   02/26/24 1004  TempSrc: Temporal  PainSc: 5          Complications: No notable events documented.

## 2024-02-26 NOTE — Interval H&P Note (Signed)
 History and Physical Interval Note:  02/26/2024 12:09 PM  Gregory Banks.  has presented today for surgery, with the diagnosis of RIGHT KNEE LATERAL MENISCUS TEAR.  The various methods of treatment have been discussed with the patient and family. After consideration of risks, benefits and other options for treatment, the patient has consented to  Procedure(s) with comments: ARTHROSCOPY, KNEE, WITH MENISCUS REPAIR (Right) - RIGHT KNEE ARTHROSCOPY WITH LATERAL MENISCUS REPAIR as a surgical intervention.  The patient's history has been reviewed, patient examined, no change in status, stable for surgery.  I have reviewed the patient's chart and labs.  Questions were answered to the patient's satisfaction.     [name]

## 2024-02-26 NOTE — Anesthesia Procedure Notes (Signed)
 Procedure Name: LMA Insertion Date/Time: 02/26/2024 12:40 PM  Performed by: Darcel Early, CRNAPre-anesthesia Checklist: Patient identified, Emergency Drugs available, Suction available, Patient being monitored and Timeout performed Patient Re-evaluated:Patient Re-evaluated prior to induction Oxygen Delivery Method: Circle system utilized Preoxygenation: Pre-oxygenation with 100% oxygen Induction Type: Combination inhalational/ intravenous induction and IV induction Ventilation: Mask ventilation without difficulty LMA: LMA inserted LMA Size: 4.0 and 5.0 Number of attempts: 2 Placement Confirmation: positive ETCO2 Dental Injury: Teeth and Oropharynx as per pre-operative assessment

## 2024-02-26 NOTE — Anesthesia Preprocedure Evaluation (Addendum)
 Anesthesia Evaluation  Patient identified by MRN, date of birth, ID band Patient awake    Reviewed: Allergy & Precautions, H&P , NPO status , Patient's Chart, lab work & pertinent test results  History of Anesthesia Complications Negative for: history of anesthetic complications  Airway Mallampati: II  TM Distance: >3 FB Neck ROM: Full    Dental no notable dental hx. (+) Dental Advisory Given, Teeth Intact   Pulmonary neg pulmonary ROS, neg shortness of breath   Pulmonary exam normal breath sounds clear to auscultation       Cardiovascular Exercise Tolerance: Good hypertension, (-) angina + CAD, + Past MI, + Cardiac Stents and +CHF  (-) DOE Normal cardiovascular exam Rhythm:Regular Rate:Normal  Echocardiogram 2D complete: (04/01/2020) INTERPRETATION MILD SEGMENTAL LV SYSTOLIC DYSFUNCTION WITH AN ESTIMATED EF = 45-50% NORMAL RIGHT VENTRICULAR SYSTOLIC FUNCTION TRIVIAL REGURGITATION NOTED (See above) NO VALVULAR STENOSIS MILD LV ENLARGEMENT APICAL ANTERIOR, INFERIOR, SEPTAL AND LATERAL WALL HYPOKINESIS     Neuro/Psych  PSYCHIATRIC DISORDERS Anxiety Depression    negative neurological ROS     GI/Hepatic negative GI ROS, Neg liver ROS,neg GERD  ,,  Endo/Other  negative endocrine ROS    Renal/GU negative Renal ROS     Musculoskeletal  (+) Arthritis ,    Abdominal   Peds  Hematology negative hematology ROS (+)   Anesthesia Other Findings   Reproductive/Obstetrics                             Anesthesia Physical Anesthesia Plan  ASA: 3  Anesthesia Plan: General   Post-op Pain Management: Tylenol  PO (pre-op)*, Gabapentin PO (pre-op)* and Toradol IV (intra-op)*   Induction: Intravenous  PONV Risk Score and Plan: 3 and Ondansetron , Dexamethasone, Treatment may vary due to age or medical condition and Midazolam   Airway Management Planned: LMA  Additional Equipment:   Intra-op Plan:    Post-operative Plan: Extubation in OR  Informed Consent: I have reviewed the patients History and Physical, chart, labs and discussed the procedure including the risks, benefits and alternatives for the proposed anesthesia with the patient or authorized representative who has indicated his/her understanding and acceptance.     Dental advisory given  Plan Discussed with: CRNA  Anesthesia Plan Comments:         Anesthesia Quick Evaluation

## 2024-02-26 NOTE — Brief Op Note (Signed)
   Brief Op Note  Date of Surgery: 02/26/2024  Preoperative Diagnosis: RIGHT KNEE LATERAL MENISCUS TEAR  Postoperative Diagnosis: same  Procedure: Procedure(s): ARTHROSCOPY, KNEE, WITH MENISCUS REPAIR  Implants: Implant Name Type Inv. Item Serial No. Manufacturer Lot No. LRB No. Used Action  ANCHOR SUT 1.8 FIBERTAK SB KL - [id number] Anchor ANCHOR SUT 1.8 [employer] [id number] Right 1 Implanted    Surgeons: Surgeon(s): Wilhelmenia Harada, MD  Anesthesia: General    Estimated Blood Loss: See anesthesia record  Complications: None  Condition to PACU: Stable  Carmina Chris, MD 02/26/2024 1:22 PM

## 2024-02-27 ENCOUNTER — Encounter (HOSPITAL_BASED_OUTPATIENT_CLINIC_OR_DEPARTMENT_OTHER): Payer: Self-pay | Admitting: Orthopaedic Surgery

## 2024-02-29 ENCOUNTER — Other Ambulatory Visit (HOSPITAL_BASED_OUTPATIENT_CLINIC_OR_DEPARTMENT_OTHER): Payer: Self-pay | Admitting: Orthopaedic Surgery

## 2024-02-29 DIAGNOSIS — S83271A Complex tear of lateral meniscus, current injury, right knee, initial encounter: Secondary | ICD-10-CM

## 2024-03-03 ENCOUNTER — Telehealth: Payer: Self-pay | Admitting: Orthopaedic Surgery

## 2024-03-03 NOTE — Telephone Encounter (Signed)
 Returned call to patient's wife explaining Pt should be seen by PT prior to first post op visit. He is scheduled 6/12 with PT and 6/18 with you and will have 2 PT appts prior. She said he has not be able to ambulate very much due to managable pain, but was unsure if PT was supposed to start before 1st post op visit.   No further questions asked

## 2024-03-03 NOTE — Telephone Encounter (Signed)
 Patient called and wanted to know how soon did you want him to start PT? CB#[phone]

## 2024-03-06 ENCOUNTER — Ambulatory Visit: Attending: Orthopaedic Surgery

## 2024-03-06 DIAGNOSIS — M25661 Stiffness of right knee, not elsewhere classified: Secondary | ICD-10-CM | POA: Insufficient documentation

## 2024-03-06 DIAGNOSIS — M25561 Pain in right knee: Secondary | ICD-10-CM | POA: Diagnosis present

## 2024-03-06 DIAGNOSIS — R262 Difficulty in walking, not elsewhere classified: Secondary | ICD-10-CM | POA: Insufficient documentation

## 2024-03-06 DIAGNOSIS — S83271A Complex tear of lateral meniscus, current injury, right knee, initial encounter: Secondary | ICD-10-CM | POA: Insufficient documentation

## 2024-03-06 NOTE — Therapy (Signed)
 OUTPATIENT PHYSICAL THERAPY EVALUATION   Patient Name: Gregory Banks. MRN: [id number] DOB:[birth year], 58 y.o., male Today's Date: 03/06/2024  END OF SESSION:  PT End of Session - 03/06/24 0958     Visit Number 1    Number of Visits 24    Date for PT Re-Evaluation 05/29/24    Authorization Type Medicare primary; [employer] commercial 2ndary    Authorization Time Period 03/06/24-05/29/24    Progress Note Due on Visit 10    PT Start Time 0950    PT Stop Time 1030    PT Time Calculation (min) 40 min    Equipment Utilized During Treatment Right knee immobilizer    Activity Tolerance Patient tolerated treatment well;No increased pain;Patient limited by pain    Behavior During Therapy [facility] for tasks assessed/performed          Past Medical History:  Diagnosis Date   Arthritis    knees, rt shoulder   Hyperlipidemia    Hypertension    MI (myocardial infarction) [facility])    Past Surgical History:  Procedure Laterality Date   CARDIAC CATHETERIZATION     Angioplasty with stent placement x 2   COLONOSCOPY WITH PROPOFOL  N/A 06/01/2017   Procedure: COLONOSCOPY WITH PROPOFOL ;  Surgeon: Luke Salaam, MD;  Location: [facility];  Service: Gastroenterology;  Laterality: N/A;   KNEE ARTHROSCOPY WITH MENISCAL REPAIR Right 02/26/2024   Procedure: ARTHROSCOPY, KNEE, WITH MENISCUS REPAIR;  Surgeon: Wilhelmenia Harada, MD;  Location: [facility];  Service: Orthopedics;  Laterality: Right;  RIGHT KNEE ARTHROSCOPY WITH LATERAL MENISCUS REPAIR   OTHER SURGICAL HISTORY     2 Cardiac stents   TUMOR REMOVAL     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   Patient Active Problem List   Diagnosis Date Noted   Complex tear of lateral meniscus of right knee as current injury 02/26/2024   Hyperlipidemia    MI (myocardial infarction) (HCC)    Intracranial mass 01/27/2015   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 He has crutches that are too short because ED only had that size. Pain has been well controlled at times.  PERTINENT HISTORY: Knee pain and locking following a day of playing 4 basketball games, meniscus injury seen. Pt underwent lateral meniscus repair with Dr. Hermina Loosen on 6/3, placed in locked in extension, WBAT. At evaluation current pain: 6/10; Best pain: 0/10 Worst Pain: 6/10, on average has been around 4 PAIN:  Are you having pain? 6/10 today, he tweaked his knee getting off the couch today.  PRECAUTIONS: WBAT, locked brace at all times when walking.  WEIGHT BEARING RESTRICTIONS: WBAT  FALLS:  Has patient fallen in last 6 months? No  LIVING ENVIRONMENT: Lives with: Wife Lives in: house  Stairs: 17 stairs to 2nd floor bedroom, no entry stairs  Has following equipment at home: crutches up to 6'6.   OCCUPATION: Retired   PLOF: Coaches basketball   PATIENT GOALS: Full  recovery   NEXT MD VISIT: June 2025   OBJECTIVE:  Note: Objective measures were completed at Evaluation unless otherwise noted.  PATIENT SURVEYS:  LEFS: 20% EDEMA:  Unable to see, still ace wrapped  LOWER EXTREMITY ROM:  ROM Right eval Left eval  Hip flexion    Hip extension    Hip abduction    Hip adduction    Hip internal rotation    Hip external rotation    Knee flexion 65 degrees   Knee extension  5 degrees   Knee internal rotation    Knee external rotation    Knee abduction    Knee adduction    Ankle  dorsiflexion    Ankle plantarflexion    Ankle inversion    Ankle eversion     (Blank rows = not tested)  TODAY'S TREATMENT 03/06/24  -stairs education with locked knee brace in place -RLE SLR x10 -RLE heel slides x20 supine, x20 seated 5-65 degrees (end range pain)  -Quads set 2x10x3secH   PATIENT EDUCATION:  Education details: HEP, stairs training, cutches sizing, polar care use Person educated: Patient and spouse  Education method: collaborative learning, deliberate practice, positive reinforcement, explicit instruction, establish rules. Education comprehension: excellent  HOME EXERCISE PROGRAM: Access Code: [facility] URL: [email or web address] Date: 03/06/2024 Prepared by: Atlee Blanks  Exercises - Small Range Straight Leg Raise  - 3-5 x daily - 1 sets - 10 reps - Supine Quad Set  - 3-5 x daily - 1 sets - 10 reps - 5sec hold - Seated Knee Flexion Extension AROM   - 3-5 x daily - 1 sets - 15-20 reps - Supine Heel Slide  - 3-5 x daily - 1 sets - 15-20 reps  ASSESSMENT:  CLINICAL IMPRESSION: 57yoM s/p Rt meniscus repair, evaluation revealing of deficits in strength and joint range, poor activity tolerance to basic ADL mobility, stairs climbing, sitting for home and travel. PT will help restore to PLOF and full independence.   OBJECTIVE IMPAIRMENTS: Decreased knowledge of condition, decreased use of DME, decreased mobility, difficulty walking, decreased strength, decreased ROM. ACTIVITY LIMITATIONS: Lifting, standing, walking, squatting, transfers, locomotion level PARTICIPATION LIMITATIONS: Cleaning, laundry, interpersonal relationships, driving, yardwork, community activity.  PERSONAL FACTORS: Age, behavior pattern, education, past/current experiences, transportation, profession  are also affecting patient's functional outcome.  REHAB POTENTIAL: Great  CLINICAL DECISION MAKING: Great EVALUATION COMPLEXITY: Low  GOALS: Goals reviewed with patient?  yes  SHORT TERM GOALS: Target date: 04/05/24 Patient will report comprehension, confidence, and consistent compliance and of a simple home exercise program established to facilitate symptoms management and basic strengthening and/or segment mobility.  Baseline: issued at eval  Goal status: INITIAL  2.  Patient to demonstrate improved score on self-report measure by >9% to indicate reduced self-reported disability and/or pain.    Baseline: LEFS: 20%  Goal status: INITIAL  3.  Pt to demonstrate Rt knee RM 0-90 degrees  Baseline: eval: 5-65 degrees Goal status: INITIAL  LONG TERM GOALS: Target date: 05/06/24  Pt to demonstrate ability to perform >1553ft without pain limitation, less than 2/10 increase in NPRS to improve ability to participate in IADL and social events.  Baseline: deferred  Goal status: INITIAL  2.  Pt to demonstrate less than 10% strength deficit in between bilat knee extension and bilat ankle PF to prepare for return to leisure and community activity.  Baseline:  Goal status: INITIAL  3.  Patient to demonstrate improved performance on initial transfers assessment AEB improved reps per time or time  per perform desired reps and/or reduced seat height and/or use of hands in order to improve safety, tolerance, and independence in ADL performance.   Baseline: 30 sec chair rise deferred  Goal status: INITIAL  4.  Pt to demonstrate <10% difference between single leg hop bilaterally.  Baseline:  Goal status: INITIAL PLAN:  PT FREQUENCY: 1-2x/week  PT DURATION: 12 weeks PLANNED INTERVENTIONS: 97110-Therapeutic exercises, 97530- Therapeutic activity, 97112- Neuromuscular re-education, [phone]- Self Care, 40981- Manual therapy, [phone]- Gait training, [phone]- Electrical stimulation (unattended), [phone]- Electrical stimulation (manual), Patient/Family education, Balance training, Stair training, Joint mobilization, Joint manipulation, Spinal manipulation, and Spinal  mobilization  PLAN FOR NEXT SESSION: review HEP, keep on working on that ROM  12:50 PM, 03/06/24 Dawn Eth, PT, DPT Physical Therapist - [facility] [phone] (Office)    Anjelo Pullman C, PT 03/06/2024, 10:59 AM

## 2024-03-10 ENCOUNTER — Telehealth: Payer: Self-pay | Admitting: Physical Therapy

## 2024-03-10 ENCOUNTER — Ambulatory Visit: Admitting: Physical Therapy

## 2024-03-10 NOTE — Telephone Encounter (Signed)
 Called pt to inquire about absence from apt. Pt's wife answered call and responded that he would be at next apt and she confirmed the time.

## 2024-03-12 ENCOUNTER — Ambulatory Visit (INDEPENDENT_AMBULATORY_CARE_PROVIDER_SITE_OTHER): Admitting: Orthopaedic Surgery

## 2024-03-12 ENCOUNTER — Ambulatory Visit: Admitting: Physical Therapy

## 2024-03-12 DIAGNOSIS — M25561 Pain in right knee: Secondary | ICD-10-CM

## 2024-03-12 DIAGNOSIS — R262 Difficulty in walking, not elsewhere classified: Secondary | ICD-10-CM

## 2024-03-12 DIAGNOSIS — M25661 Stiffness of right knee, not elsewhere classified: Secondary | ICD-10-CM

## 2024-03-12 DIAGNOSIS — S83271A Complex tear of lateral meniscus, current injury, right knee, initial encounter: Secondary | ICD-10-CM

## 2024-03-12 NOTE — Progress Notes (Signed)
 Post Operative Evaluation    Procedure/Date of Surgery: Right knee lateral meniscal repair 6/3  Interval History:   Presents 2 weeks status post the above procedure.  Overall he is doing extremely well.  He has been progressing weightbearing as well as range of motion.   PMH/PSH/Family History/Social History/Meds/Allergies:    Past Medical History:  Diagnosis Date   Arthritis    knees, rt shoulder   Hyperlipidemia    Hypertension    MI (myocardial infarction) [facility])    Past Surgical History:  Procedure Laterality Date   CARDIAC CATHETERIZATION     Angioplasty with stent placement x 2   COLONOSCOPY WITH PROPOFOL  N/A 06/01/2017   Procedure: COLONOSCOPY WITH PROPOFOL ;  Surgeon: Luke Salaam, MD;  Location: [facility];  Service: Gastroenterology;  Laterality: N/A;   KNEE ARTHROSCOPY WITH MENISCAL REPAIR Right 02/26/2024   Procedure: ARTHROSCOPY, KNEE, WITH MENISCUS REPAIR;  Surgeon: Wilhelmenia Harada, MD;  Location: [facility];  Service: Orthopedics;  Laterality: Right;  RIGHT KNEE ARTHROSCOPY WITH LATERAL MENISCUS REPAIR   OTHER SURGICAL HISTORY     2 Cardiac stents   TUMOR REMOVAL     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   Social History   Socioeconomic History   Marital status: Married    Spouse name: Not on file   Number of children: Not on file   Years of education: Not on file   Highest education level: Not on file  Occupational History   Not on file  Tobacco Use   Smoking status: Never   Smokeless tobacco: Never  Substance and Sexual Activity   Alcohol use: Yes    Comment: social   Drug use: No   Sexual activity: Yes  Other Topics Concern   Not on file  Social History Narrative   Not on file   Social Drivers of Health   Financial Resource Strain: Low Risk  (10/26/2023)   Received from [facility]   Overall Financial Resource Strain (CARDIA)    Difficulty of Paying Living Expenses: Not hard at all   Food Insecurity: No Food Insecurity (10/26/2023)   Received from [facility]   Hunger Vital Sign    Within the past 12 months, you worried that your food would run out before you got the money to buy more.: Never true    Within the past 12 months, the food you bought just didn't last and you didn't have money to get more.: Never true  Transportation Needs: No Transportation Needs (10/26/2023)   Received from [facility] - Transportation    In the past 12 months, has lack of transportation kept you from medical appointments or from getting medications?: No    Lack of Transportation (Non-Medical): No  Physical Activity: Not on file  Stress: Not on file  Social Connections: Not on file   No family history on file. Allergies  Allergen Reactions   Oxycodone -Acetaminophen  Nausea And Vomiting   Current Outpatient Medications  Medication Sig Dispense Refill   aspirin  EC 325 MG tablet Take 1 tablet (325 mg total) by mouth daily. 30 tablet 0   lisinopril (PRINIVIL,ZESTRIL) 20 MG tablet Take 10 mg by mouth daily.     Metoprolol Succinate 25 MG CS24 Take 25 mg by mouth daily.  Multiple Vitamin (MULTIVITAMIN WITH MINERALS) TABS tablet Take 1 tablet by mouth daily.     nitroGLYCERIN (NITROSTAT) 0.4 MG SL tablet Place under the tongue.     oxycodone  (OXY-IR) 5 MG capsule Take 1 capsule (5 mg total) by mouth every 4 (four) hours as needed (severe pain). 10 capsule 0   oxyCODONE  (ROXICODONE ) 5 MG immediate release tablet Take 1 tablet (5 mg total) by mouth every 4 (four) hours as needed for severe pain (pain score 7-10) or breakthrough pain. 10 tablet 0   pravastatin (PRAVACHOL) 20 MG tablet Take 20 mg by mouth at bedtime.     ticagrelor (BRILINTA) 90 MG TABS tablet Take by mouth 2 (two) times daily.     No current facility-administered medications for this visit.   No results found.  Review of Systems:   A ROS was performed including pertinent  positives and negatives as documented in the HPI.   Musculoskeletal Exam:    There were no vitals taken for this visit.  Right knee with well-appearing incision.  Range of motion is from 0 to 90 degrees.  Mildly atrophied quad.  Distal neurosensory exam is intact  Imaging:      I personally reviewed and interpreted the radiographs.   Assessment:   2 weeks status post right knee lateral meniscal repair.  Overall he is doing extremely well.  Range of motion is quite well at today's visit.  I would like him to stay in his brace for 2 additional weeks as he does have weakness in the quad still.  I will plan to see him back in 4 weeks for reassessment  Plan :    - Return to clinic 4 weeks for reassessment      I personally saw and evaluated the patient, and participated in the management and treatment plan.  Wilhelmenia Harada, MD Attending Physician, Orthopedic Surgery  This document was dictated using Dragon voice recognition software. A reasonable attempt at proof reading has been made to minimize errors.

## 2024-03-12 NOTE — Therapy (Unsigned)
 OUTPATIENT PHYSICAL THERAPY TREATMENT   Patient Name: Gregory Banks. MRN: [id number] DOB:08-13-1966, 58 y.o., male Today's Date: 03/12/2024  END OF SESSION:  PT End of Session - 03/12/24 1733     Visit Number 2    Number of Visits 24    Date for PT Re-Evaluation 05/29/24    Authorization Type Medicare primary; [employer] commercial 2ndary    Authorization Time Period 03/06/24-05/29/24    Authorization - Number of Visits 2    Progress Note Due on Visit 10    PT Start Time 1730    PT Stop Time 1815    PT Time Calculation (min) 45 min    Equipment Utilized During Treatment Right knee immobilizer    Activity Tolerance Patient tolerated treatment well;No increased pain    Behavior During Therapy WFL for tasks assessed/performed          Past Medical History:  Diagnosis Date   Arthritis    knees, rt shoulder   Hyperlipidemia    Hypertension    MI (myocardial infarction) [facility])    Past Surgical History:  Procedure Laterality Date   CARDIAC CATHETERIZATION     Angioplasty with stent placement x 2   COLONOSCOPY WITH PROPOFOL  N/A 06/01/2017   Procedure: COLONOSCOPY WITH PROPOFOL ;  Surgeon: Luke Salaam, MD;  Location: [facility] END[facility]COPY;  Service: Gastroenterology;  Laterality: N/A;   KNEE ARTHROSCOPY WITH MENISCAL REPAIR Right 02/26/2024   Procedure: ARTHROSCOPY, KNEE, WITH MENISCUS REPAIR;  Surgeon: Wilhelmenia Harada, MD;  Location: [facility];  Service: Orthopedics;  Laterality: Right;  RIGHT KNEE ARTHROSCOPY WITH LATERAL MENISCUS REPAIR   OTHER SURGICAL HISTORY     2 Cardiac stents   TUMOR REMOVAL     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   Patient Active Problem List   Diagnosis Date Noted   Complex tear of lateral meniscus of right knee as current injury 02/26/2024   Hyperlipidemia    MI (myocardial infarction) (HCC)    Intracranial mass 01/27/2015   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   Anxiety 03/04/2014   Chronic coronary artery disease 03/04/2014   Depression 03/04/2014   Hypertension  03/04/2014   Shortness of breath 03/04/2014   PCP: Eartha Gold, MD  REFERRING PROVIDER: Darcel Early, MD (orthopedics)  REFERRING DIAG: s/p Rt latera meniscus repair  THERAPY DIAG:  Stiffness of right knee, not elsewhere classified  Acute pain of right knee  Difficulty in walking, not elsewhere classified  Rationale for Evaluation and Treatment: Rehabilitation  ONSET DATE: 02/26/24  SUBJECTIVE:   SUBJECTIVE STATEMENT: Pt has been doing exercises without much difficulty and pain is well controlled.    PERTINENT HISTORY: Knee pain and locking following a day of playing 4 basketball games, meniscus injury seen. Pt underwent lateral meniscus repair with Dr. Hermina Loosen on 6/3, placed in locked in extension, WBAT. At evaluation current pain: 6/10; Best pain: 0/10 Worst Pain: 6/10, on average has been around 4 PAIN:  Are you having pain? 6/10 today, he tweaked his knee getting off the couch today.  PRECAUTIONS: WBAT, locked brace at all times when walking.  WEIGHT BEARING RESTRICTIONS: WBAT  FALLS:  Has patient fallen in last 6 months? No  LIVING ENVIRONMENT: Lives with: Wife Lives in: house  Stairs: 17 stairs to 2nd floor bedroom, no entry stairs  Has following equipment at home: crutches up to 6'6.   OCCUPATION: Retired   PLOF: Coaches basketball   PATIENT GOALS: Full recovery   NEXT MD VISIT: June 2025   OBJECTIVE:  Note:  Objective measures were completed at Evaluation unless otherwise noted.  PATIENT SURVEYS:  LEFS: 20% EDEMA:  Unable to see, still ace wrapped  LOWER EXTREMITY ROM:  ROM Right eval Left eval  Hip flexion    Hip extension    Hip abduction    Hip adduction    Hip internal rotation    Hip external rotation    Knee flexion 65 degrees   Knee extension  5 degrees   Knee internal rotation    Knee external rotation    Knee abduction    Knee adduction    Ankle dorsiflexion    Ankle plantarflexion    Ankle inversion    Ankle eversion      (Blank rows = not tested)  TODAY'S TREATMENT   03/12/24  THEREX: All exercises performed on RLE  Nu-Step seat at a 18 for 6 min  Seated HS Isometric 5 sec hold 1 x 10   Supine HS Isometric 5 sec hold 1 x 10  Supine Heel Slides on RUE 1 x 15 Hip Abduction 1 x 10      PATIENT EDUCATION:  Education details: HEP, stairs training, cutches sizing, polar care use Person educated: Patient and spouse  Education method: collaborative learning, deliberate practice, positive reinforcement, explicit instruction, establish rules. Education comprehension: excellent  HOME EXERCISE PROGRAM: Access Code: [facility] URL: [email or web address] Date: 03/12/2024 Prepared by: Marge Shed  Exercises - Supine Heel Slide with Strap  - 1 x daily - 7 x weekly - 3 sets - 15 reps - Supine Isometric Hamstring Set  - 3-4 x weekly - 3 sets - 10 reps - 5 sec hold hold - Small Range Straight Leg Raise  - 3-4 x daily - 2 sets - 10 reps - Sidelying Hip Abduction  - 3-4 x weekly - 3 sets - 10 reps - Supine Quad Set  - 3-4 x daily - 2 sets - 10 reps - 5sec hold  ASSESSMENT:  CLINICAL IMPRESSION: 57yoM s/p Rt meniscus repair, evaluation revealing of deficits in strength and joint range, poor activity tolerance to basic ADL mobility, stairs climbing, sitting for home and travel. PT will help restore to PLOF and full independence.   OBJECTIVE IMPAIRMENTS: Decreased knowledge of condition, decreased use of DME, decreased mobility, difficulty walking, decreased strength, decreased ROM. ACTIVITY LIMITATIONS: Lifting, standing, walking, squatting, transfers, locomotion level PARTICIPATION LIMITATIONS: Cleaning, laundry, interpersonal relationships, driving, yardwork, community activity.  PERSONAL FACTORS: Age, behavior pattern, education, past/current experiences, transportation, profession  are also affecting patient's functional outcome.  REHAB POTENTIAL: Great  CLINICAL DECISION MAKING:  Great EVALUATION COMPLEXITY: Low  GOALS: Goals reviewed with patient? yes  SHORT TERM GOALS: Target date: 04/05/24 Patient will report comprehension, confidence, and consistent compliance and of a simple home exercise program established to facilitate symptoms management and basic strengthening and/or segment mobility.  Baseline: issued at eval  Goal status: ONGOING   2.  Patient to demonstrate improved score on self-report measure by >9% to indicate reduced self-reported disability and/or pain.    Baseline: LEFS: 20%  Goal status: INITIAL  3.  Pt to demonstrate Rt knee RM 0-90 degrees  Baseline: eval: 5-65 degrees Goal status: INITIAL  LONG TERM GOALS: Target date: 05/06/24  Pt to demonstrate ability to perform >1597ft without pain limitation, less than 2/10 increase in NPRS to improve ability to participate in IADL and social events.  Baseline: deferred  Goal status: INITIAL  2.  Pt to demonstrate less than 10% strength deficit in between bilat  knee extension and bilat ankle PF to prepare for return to leisure and community activity.  Baseline:  Goal status: INITIAL  3.  Patient to demonstrate improved performance on initial transfers assessment AEB improved reps per time or time per perform desired reps and/or reduced seat height and/or use of hands in order to improve safety, tolerance, and independence in ADL performance.   Baseline: 30 sec chair rise deferred  Goal status: INITIAL  4.  Pt to demonstrate <10% difference between single leg hop bilaterally.  Baseline: NT  Goal status: ONGOING   PLAN:  PT FREQUENCY: 1-2x/week  PT DURATION: 12 weeks PLANNED INTERVENTIONS: 97110-Therapeutic exercises, 97530- Therapeutic activity, 97112- Neuromuscular re-education, [phone]- Self Care, 60454- Manual therapy, [phone]- Gait training, [phone]- Electrical stimulation (unattended), [phone]- Electrical stimulation (manual), Patient/Family education, Balance training, Stair training, Joint  mobilization, Joint manipulation, Spinal manipulation, and Spinal mobilization  PLAN FOR NEXT SESSION: review HEP, keep on working on that ROM   Marge Shed PT, DPT  [facility] [address] [place], [address] Phone: [phone]   Fax:  [phone]

## 2024-03-16 ENCOUNTER — Other Ambulatory Visit (HOSPITAL_BASED_OUTPATIENT_CLINIC_OR_DEPARTMENT_OTHER): Payer: Self-pay | Admitting: Orthopaedic Surgery

## 2024-03-17 ENCOUNTER — Ambulatory Visit: Admitting: Physical Therapy

## 2024-03-20 ENCOUNTER — Ambulatory Visit: Admitting: Physical Therapy

## 2024-03-20 DIAGNOSIS — M25661 Stiffness of right knee, not elsewhere classified: Secondary | ICD-10-CM | POA: Diagnosis not present

## 2024-03-20 DIAGNOSIS — M25561 Pain in right knee: Secondary | ICD-10-CM

## 2024-03-20 NOTE — Therapy (Signed)
 OUTPATIENT PHYSICAL THERAPY TREATMENT   Patient Name: Gregory Banks. MRN: [id number] DOB:1966/06/01, 58 y.o., male Today's Date: 03/20/2024  END OF SESSION:  PT End of Session - 03/20/24 1740     Visit Number 3    Number of Visits 24    Date for PT Re-Evaluation 05/29/24    Authorization Type Medicare primary; [employer]ndary    Authorization Time Period 03/06/24-05/29/24    Authorization - Visit Number 3    Authorization - Number of Visits 24    Progress Note Due on Visit 10    PT Start Time 1730    PT Stop Time 1815    PT Time Calculation (min) 45 min    Equipment Utilized During Treatment Right knee immobilizer    Activity Tolerance Patient tolerated treatment well;No increased pain    Behavior During Therapy WFL for tasks assessed/performed           Past Medical History:  Diagnosis Date   Arthritis    knees, rt shoulder   Hyperlipidemia    Hypertension    MI (myocardial infarction) [facility])    Past Surgical History:  Procedure Laterality Date   CARDIAC CATHETERIZATION     Angioplasty with stent placement x 2   COLONOSCOPY WITH PROPOFOL  N/A 06/01/2017   Procedure: COLONOSCOPY WITH PROPOFOL ;  Surgeon: Therisa Bi, MD;  Location: [facility] ENDOSCOPY;  Service: Gastroenterology;  Laterality: N/A;   KNEE ARTHROSCOPY WITH MENISCAL REPAIR Right 02/26/2024   Procedure: ARTHROSCOPY, KNEE, WITH MENISCUS REPAIR;  Surgeon: Genelle Standing, MD;  Location: [facility];  Service: Orthopedics;  Laterality: Right;  RIGHT KNEE ARTHROSCOPY WITH LATERAL MENISCUS REPAIR   OTHER SURGICAL HISTORY     2 Cardiac stents   TUMOR REMOVAL     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  Rationale for Evaluation and Treatment: Rehabilitation  ONSET DATE: 02/26/24  SUBJECTIVE:   SUBJECTIVE STATEMENT: Pt reports that the stiffness in his knee is much improved.    PERTINENT HISTORY: Knee pain and locking following a day of playing 4 basketball games, meniscus injury seen. Pt underwent lateral meniscus repair with Dr. Genelle on 6/3, placed in locked in extension, WBAT. At evaluation current pain: 6/10; Best pain: 0/10 Worst Pain: 6/10, on average has been around 4 PAIN:  Are you having pain? 6/10 today, he tweaked his knee getting off the couch today.  PRECAUTIONS: WBAT, locked brace at all times when walking.  WEIGHT BEARING RESTRICTIONS: WBAT  FALLS:  Has patient fallen in last 6 months? No  LIVING ENVIRONMENT: Lives with: Wife Lives in: house  Stairs: 17 stairs to 2nd floor bedroom, no entry stairs  Has following equipment at home: crutches up to 6'6.   OCCUPATION: Retired   PLOF: Coaches basketball   PATIENT GOALS: Full recovery   NEXT MD VISIT: June 2025   OBJECTIVE:  Note: Objective measures were completed at Evaluation unless otherwise noted.  PATIENT SURVEYS:  LEFS: 20% EDEMA:  Unable to see, still ace wrapped  LOWER EXTREMITY ROM:  A/PROM Right eval Left eval Right  03/20/24  Hip flexion     Hip extension     Hip abduction     Hip adduction     Hip internal rotation     Hip external rotation     Knee flexion 65 degrees  83/90  Knee extension  5 degrees  1/0  Knee internal rotation     Knee external rotation     Knee abduction     Knee adduction     Ankle dorsiflexion     Ankle plantarflexion     Ankle inversion     Ankle eversion      (Blank rows = not tested)  TODAY'S TREATMENT    03/20/24 THEREX : 1,200 with knee locked into extension in brace  Quarter Wall Squat 1 x 10  Quarter Wall Squat with silver ball 2 x 10   Knee Ext AROM R/L 0/1   Knee Ext PROM R 0  Knee Flex A/PROM R 83/90  Knee Flex AAROM RLE  2 x 10  SLR on RLE - 1 x 10 no extensor lag   NMR: All performed on RLE and brace locked at 0 degrees   Single leg stance 5 x 10 sec  Single leg stance 2 x 10 with horizontal head turns  -mild sway with ankle strategy   Single leg stance 2 x 10 with vertical head turns      PATIENT EDUCATION:  Education details: HEP, stairs training, cutches sizing, polar care use Person educated: Patient and spouse  Education method: collaborative learning, deliberate practice, positive reinforcement, explicit instruction, establish rules. Education comprehension: excellent  HOME EXERCISE PROGRAM: Access Code: [facility] URL: [email or web address] Date: 03/12/2024 Prepared by: Toribio Servant  Exercises - Supine Heel Slide with Strap  - 1 x daily - 7 x weekly - 3 sets - 15 reps - Supine Isometric Hamstring Set  - 3-4 x weekly - 3 sets - 10 reps - 5 sec hold hold - Small Range Straight Leg Raise  - 3-4 x daily - 2 sets - 10 reps - Sidelying Hip Abduction  - 3-4 x weekly - 3 sets - 10 reps - Supine Quad Set  - 3-4 x daily - 2 sets - 10 reps - 5sec hold  ASSESSMENT:  CLINICAL IMPRESSION: Pt is now s/p 3 weeks lateral meniscus repair of right knee. Pt continues to show excellent progress towards goals with knee mobility and strength. He has little to no extensor lag with straight leg raise indicating improvement quad strength. He is still in the immediate postoperative until next week when he will begin stage 2 of the intermediate postop phase. He will continue to benefit from skilled PT to improve right knee mobility and strength to return to playing walking without deficits and to playing basketball.    OBJECTIVE IMPAIRMENTS: Decreased knowledge of  condition, decreased use of DME, decreased mobility, difficulty walking, decreased strength, decreased ROM. ACTIVITY LIMITATIONS: Lifting, standing, walking, squatting, transfers, locomotion level PARTICIPATION LIMITATIONS: Cleaning, laundry, interpersonal relationships, driving, yardwork, community activity.  PERSONAL FACTORS: Age, behavior pattern, education, past/current experiences, transportation, profession  are also affecting patient's functional outcome.  REHAB POTENTIAL: Great  CLINICAL DECISION MAKING: Great EVALUATION COMPLEXITY: Low  GOALS: Goals reviewed with patient? yes  SHORT TERM GOALS: Target date: 04/05/24 Patient will report comprehension, confidence, and consistent compliance and of a simple home exercise  program established to facilitate symptoms management and basic strengthening and/or segment mobility.  Baseline: issued at eval 03/12/24: Able to perform HEP independently  Goal status: ACHIEVED    2.  Patient to demonstrate improved score on self-report measure by >9% to indicate reduced self-reported disability and/or pain.    Baseline: LEFS: 20%  Goal status: ONGOING   3.  Pt to demonstrate Rt knee RM 0-90 degrees  Baseline: eval: 5-65 deg 03/20/24: 0-83 Goal status: ONGOING   LONG TERM GOALS: Target date: 05/06/24  Pt to demonstrate ability to perform >1574ft without pain limitation, less than 2/10 increase in NPRS to improve ability to participate in IADL and social events.  Baseline: 1,200 ft  Goal status: ONGOING  2.  Pt to demonstrate less than 10% strength deficit in between bilat knee extension and bilat ankle PF to prepare for return to leisure and community activity.  Baseline: Not able to load knee at this point in protocol   Goal status: ONGOING    3.  Patient to demonstrate improved performance on initial transfers assessment AEB improved reps per time or time per perform desired reps and/or reduced seat height and/or use of hands in order to  improve safety, tolerance, and independence in ADL performance.   Baseline: 30 sec chair rise deferred  Goal status: ONGOING   4.  Pt to demonstrate <10% difference between single leg hop bilaterally.  Baseline: Not able to perform due to restrictions  Goal status: ONGOING   PLAN:  PT FREQUENCY: 1-2x/week  PT DURATION: 12 weeks PLANNED INTERVENTIONS: 97110-Therapeutic exercises, 97530- Therapeutic activity, 97112- Neuromuscular re-education, 97535- Self Care, 02859- Manual therapy, [phone]- Gait training, [phone]- Electrical stimulation (unattended), [phone]- Electrical stimulation (manual), Patient/Family education, Balance training, Stair training, Joint mobilization, Joint manipulation, Spinal manipulation, and Spinal mobilization  PLAN FOR NEXT SESSION: Cycling for warm up. Toe Raises. Standing Hip Ext, ABD, and Flex. Long [employer]. Prone Quad Stretch. Knee Ext Wenceslao Toribio Servant PT, DPT  [facility] [address] [place], [address] Phone: [phone]   Fax:  [phone]

## 2024-03-25 ENCOUNTER — Ambulatory Visit: Attending: Orthopaedic Surgery | Admitting: Physical Therapy

## 2024-03-25 DIAGNOSIS — M25561 Pain in right knee: Secondary | ICD-10-CM | POA: Diagnosis present

## 2024-03-25 DIAGNOSIS — R262 Difficulty in walking, not elsewhere classified: Secondary | ICD-10-CM | POA: Insufficient documentation

## 2024-03-25 DIAGNOSIS — M25661 Stiffness of right knee, not elsewhere classified: Secondary | ICD-10-CM | POA: Insufficient documentation

## 2024-03-25 NOTE — Therapy (Unsigned)
 OUTPATIENT PHYSICAL THERAPY TREATMENT   Patient Name: Gregory Banks. MRN: [id number] DOB:Apr 12, 1966, 58 y.o., male Today's Date: 03/25/2024  END OF SESSION:  PT End of Session - 03/25/24 1740     Visit Number 4    Number of Visits 24    Date for PT Re-Evaluation 05/29/24    Authorization Type Medicare primary; [employer] 2ndary    Authorization Time Period 03/06/24-05/29/24    Authorization - Visit Number 4    Authorization - Number of Visits 24    Progress Note Due on Visit 10    PT Start Time 1735    PT Stop Time 1815    PT Time Calculation (min) 40 min    Equipment Utilized During Treatment Right knee immobilizer    Activity Tolerance Patient tolerated treatment well;No increased pain    Behavior During Therapy WFL for tasks assessed/performed            Past Medical History:  Diagnosis Date   Arthritis    knees, rt shoulder   Hyperlipidemia    Hypertension    MI (myocardial infarction) [facility])    Past Surgical History:  Procedure Laterality Date   CARDIAC CATHETERIZATION     Angioplasty with stent placement x 2   COLONOSCOPY WITH PROPOFOL  N/A 06/01/2017   Procedure: COLONOSCOPY WITH PROPOFOL ;  Surgeon: Therisa Bi, MD;  Location: [facility] ENDOSCOPY;  Service: Gastroenterology;  Laterality: N/A;   KNEE ARTHROSCOPY WITH MENISCAL REPAIR Right 02/26/2024   Procedure: ARTHROSCOPY, KNEE, WITH MENISCUS REPAIR;  Surgeon: Genelle Standing, MD;  Location: [facility];  Service: Orthopedics;  Laterality: Right;  RIGHT KNEE ARTHROSCOPY WITH LATERAL MENISCUS REPAIR   OTHER SURGICAL HISTORY     2 Cardiac stents   TUMOR REMOVAL     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   Patient Active Problem List   Diagnosis Date Noted   Complex tear of lateral meniscus of right knee as current injury 02/26/2024   Hyperlipidemia    MI (myocardial infarction) (HCC)    Intracranial mass 01/27/2015   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   Anxiety 03/04/2014   Chronic coronary artery disease 03/04/2014    Depression 03/04/2014   Hypertension 03/04/2014   Shortness of breath 03/04/2014   PCP: Epifanio Alm SQUIBB, MD  REFERRING PROVIDER: [name] Genelle, MD (orthopedics)  REFERRING DIAG: s/p Rt latera meniscus repair  THERAPY DIAG:  Stiffness of right knee, not elsewhere classified  Acute pain of right knee  Difficulty in walking, not elsewhere classified  Rationale for Evaluation and Treatment: Rehabilitation  ONSET DATE: 02/26/24  SUBJECTIVE:   SUBJECTIVE STATEMENT: Pt reports that the stiffness in his knee is much improved.    PERTINENT HISTORY: Knee pain and locking following a day of playing 4 basketball games, meniscus injury seen. Pt underwent lateral meniscus repair with Dr. Genelle on 6/3, placed in locked in extension, WBAT. At evaluation current pain: 6/10; Best pain: 0/10 Worst Pain: 6/10, on average has been around 4 PAIN:  Are you having pain? 6/10 today, he tweaked his knee getting off the couch today.  PRECAUTIONS: WBAT, locked brace at all times when walking.  WEIGHT BEARING RESTRICTIONS: WBAT  FALLS:  Has patient fallen in last 6 months? No  LIVING ENVIRONMENT: Lives with: Wife Lives in: house  Stairs: 17 stairs to 2nd floor bedroom, no entry stairs  Has following equipment at home: crutches up to 6'6.   OCCUPATION: Retired   PLOF: Coaches basketball   PATIENT GOALS: Full recovery   NEXT MD  VISIT: June 2025   OBJECTIVE:  Note: Objective measures were completed at Evaluation unless otherwise noted.  PATIENT SURVEYS:  LEFS: 20% EDEMA:  Unable to see, still ace wrapped  LOWER EXTREMITY ROM:  A/PROM Right eval Left eval Right  03/20/24 Right  03/25/24  Hip flexion      Hip extension      Hip abduction      Hip adduction      Hip internal rotation      Hip external rotation      Knee flexion 65 degrees  83/90 90  Knee extension  5 degrees  1/0 0  Knee internal rotation      Knee external rotation      Knee abduction      Knee adduction       Ankle dorsiflexion      Ankle plantarflexion      Ankle inversion      Ankle eversion       (Blank rows = not tested)  TODAY'S TREATMENT   03/25/24:  THEREX Matrix Recumbent Bicycle half rotations (<90 degrees) for 5 min  HS Bridges 2 x 10   HS Bridges with marches 1 x 10  Banded Mini-Squat with blue band 3 x 10  32 mat height   Knee AROM Ext R 0 Knee AROM Flex R 90  Knee Circumference R/L 17/18  Modified Single Leg Heel Raise on RLE on 4 inch step 3 x 15     PATIENT EDUCATION:  Education details: HEP, stairs training, cutches sizing, polar care use Person educated: Patient and spouse  Education method: collaborative learning, deliberate practice, positive reinforcement, explicit instruction, establish rules. Education comprehension: excellent  HOME EXERCISE PROGRAM: Access Code: [facility] URL: [email or web address] Date: 03/25/2024 Prepared by: Toribio Servant  Exercises - Supine Heel Slide with Strap  - 1 x daily - 7 x weekly - 3 sets - 15 reps - Small Range Straight Leg Raise  - 3-4 x daily - 2 sets - 10 reps - Sidelying Hip Abduction  - 3-4 x weekly - 3 sets - 10 reps - Single Leg Stance  - 1 x daily - 3-4 x weekly - 5 reps - 10 sec  hold - Marching Bridge  - 3-4 x weekly - 3 sets - 10 reps - Squat with Chair Touch and Resistance Loop  - 3-4 x weekly - 3 sets - 10 reps  ASSESSMENT:  CLINICAL IMPRESSION: Pt is now s/p 4 weeks lateral meniscus repair of right knee. He continues to show tremendous progress towards rehab goals with improved knee ROM now reaching full extension. He continues to experience little to no increase in his knee pain despite progressing knee strengthening exercises to include weight bearing positions. He is now in stage 2 of the immediate post operative phase of his protocol and he will continue to remain in this phase until week 6 or until orthopedic surgeon gives him permission to go beyond 90 degrees of knee flexion. PT  continued to reiterate that patient not go beyond 90 degrees of knee flexion while he is in this phase. His right knee still has increased swelling compared to left knee but this is to be expected given that he is still only 4 weeks out from surgery. He will continue to benefit from skilled PT to improve right knee mobility and strength to return to playing walking without deficits and to playing basketball.     OBJECTIVE IMPAIRMENTS: Decreased knowledge of condition, decreased use of  DME, decreased mobility, difficulty walking, decreased strength, decreased ROM. ACTIVITY LIMITATIONS: Lifting, standing, walking, squatting, transfers, locomotion level PARTICIPATION LIMITATIONS: Cleaning, laundry, interpersonal relationships, driving, yardwork, community activity.  PERSONAL FACTORS: Age, behavior pattern, education, past/current experiences, transportation, profession  are also affecting patient's functional outcome.  REHAB POTENTIAL: Great  CLINICAL DECISION MAKING: Great EVALUATION COMPLEXITY: Low  GOALS: Goals reviewed with patient? yes  SHORT TERM GOALS: Target date: 04/05/24 Patient will report comprehension, confidence, and consistent compliance and of a simple home exercise program established to facilitate symptoms management and basic strengthening and/or segment mobility.  Baseline: issued at eval 03/12/24: Able to perform HEP independently  Goal status: ACHIEVED    2.  Patient to demonstrate improved score on self-report measure by >9% to indicate reduced self-reported disability and/or pain.    Baseline: LEFS: 20%  Goal status: ONGOING   3.  Pt to demonstrate Rt knee RM 0-90 degrees  Baseline: eval: 5-65 deg 03/20/24: 0-83 Goal status: ONGOING   LONG TERM GOALS: Target date: 05/06/24  Pt to demonstrate ability to perform >1552ft without pain limitation, less than 2/10 increase in NPRS to improve ability to participate in IADL and social events.  Baseline: 1,200 ft  Goal  status: ONGOING  2.  Pt to demonstrate less than 10% strength deficit in between bilat knee extension and bilat ankle PF to prepare for return to leisure and community activity.  Baseline: Not able to load knee at this point in protocol   Goal status: ONGOING    3.  Patient to demonstrate improved performance on initial transfers assessment AEB improved reps per time or time per perform desired reps and/or reduced seat height and/or use of hands in order to improve safety, tolerance, and independence in ADL performance.   Baseline: 30 sec chair rise deferred  Goal status: ONGOING   4.  Pt to demonstrate <10% difference between single leg hop bilaterally.  Baseline: Not able to perform due to restrictions  Goal status: ONGOING   PLAN:  PT FREQUENCY: 1-2x/week  PT DURATION: 12 weeks PLANNED INTERVENTIONS: 97110-Therapeutic exercises, 97530- Therapeutic activity, 97112- Neuromuscular re-education, 97535- Self Care, 02859- Manual therapy, [phone]- Gait training, [phone]- Electrical stimulation (unattended), [phone]- Electrical stimulation (manual), Patient/Family education, Balance training, Stair training, Joint mobilization, Joint manipulation, Spinal manipulation, and Spinal mobilization  PLAN FOR NEXT SESSION: Nu-Step for warm up. Single leg stance and other balance activities -single leg stance on foam, head turns and eyes closed possibly bring in blaze pods here. Standing Hip Ext, ABD, and flex: with band or ankle weights and only up to 5 lbs. Long Arch Quads and/pr straight leg raise with ankle weight <=5 lbs. Banded lateral side step and monster walks. Test knee extension strength.    Toribio Servant PT, DPT  [facility] [address], [place], [address] Phone: [phone]   Fax:  [phone]

## 2024-03-27 ENCOUNTER — Ambulatory Visit: Admitting: Physical Therapy

## 2024-03-31 ENCOUNTER — Ambulatory Visit: Admitting: Physical Therapy

## 2024-04-02 ENCOUNTER — Ambulatory Visit: Admitting: Physical Therapy

## 2024-04-02 DIAGNOSIS — R262 Difficulty in walking, not elsewhere classified: Secondary | ICD-10-CM

## 2024-04-02 DIAGNOSIS — M25661 Stiffness of right knee, not elsewhere classified: Secondary | ICD-10-CM

## 2024-04-02 DIAGNOSIS — M25561 Pain in right knee: Secondary | ICD-10-CM

## 2024-04-02 NOTE — Therapy (Signed)
 OUTPATIENT PHYSICAL THERAPY TREATMENT   Patient Name: Gregory Banks. MRN: [id number] DOB:October 30, 1965, 59 y.o., male Today's Date: 04/02/2024  END OF SESSION:  PT End of Session - 04/02/24 1607     Visit Number 5    Number of Visits 24    Date for PT Re-Evaluation 05/29/24    Authorization Type Medicare primary; eneric commercial 2ndary    Authorization Time Period 03/06/24-05/29/24    Authorization - Visit Number 5    Authorization - Number of Visits 24    Progress Note Due on Visit 10    PT Start Time 1603    PT Stop Time 1645    PT Time Calculation (min) 42 min    Equipment Utilized During Treatment Right knee immobilizer    Activity Tolerance Patient tolerated treatment well;No increased pain    Behavior During Therapy WFL for tasks assessed/performed            Past Medical History:  Diagnosis Date   Arthritis    knees, rt shoulder   Hyperlipidemia    Hypertension    MI (myocardial infarction) [facility])    Past Surgical History:  Procedure Laterality Date   CARDIAC CATHETERIZATION     Angioplasty with stent placement x 2   COLONOSCOPY WITH PROPOFOL  N/A 06/01/2017   Procedure: COLONOSCOPY WITH PROPOFOL ;  Surgeon: Therisa Bi, MD;  Location: [facility] ENDOSCOPY;  Service: Gastroenterology;  Laterality: N/A;   KNEE ARTHROSCOPY WITH MENISCAL REPAIR Right 02/26/2024   Procedure: ARTHROSCOPY, KNEE, WITH MENISCUS REPAIR;  Surgeon: Genelle Standing, MD;  Location: [facility];  Service: Orthopedics;  Laterality: Right;  RIGHT KNEE ARTHROSCOPY WITH LATERAL MENISCUS REPAIR   OTHER SURGICAL HISTORY     2 Cardiac stents   TUMOR REMOVAL     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   Patient Active Problem List   Diagnosis Date Noted   Complex tear of lateral meniscus of right knee as current injury 02/26/2024   Hyperlipidemia    MI (myocardial infarction) (HCC)    Intracranial mass 01/27/2015   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   Anxiety 03/04/2014   Chronic coronary artery disease 03/04/2014    Depression 03/04/2014   Hypertension 03/04/2014   Shortness of breath 03/04/2014   PCP: Epifanio Alm SQUIBB, MD  REFERRING PROVIDER: [name] Genelle, MD (orthopedics)  REFERRING DIAG: s/p Rt latera meniscus repair  THERAPY DIAG:  Stiffness of right knee, not elsewhere classified  Acute pain of right knee  Difficulty in walking, not elsewhere classified  Rationale for Evaluation and Treatment: Rehabilitation  ONSET DATE: 02/26/24  SUBJECTIVE:   SUBJECTIVE STATEMENT: Pt reports ongoing improvement in pain and stiffness in right knee.   PERTINENT HISTORY: Knee pain and locking following a day of playing 4 basketball games, meniscus injury seen. Pt underwent lateral meniscus repair with Dr. Genelle on 6/3, placed in locked in extension, WBAT. At evaluation current pain: 6/10; Best pain: 0/10 Worst Pain: 6/10, on average has been around 4 PAIN:  Are you having pain? 6/10 today, he tweaked his knee getting off the couch today.  PRECAUTIONS: WBAT, locked brace at all times when walking.  WEIGHT BEARING RESTRICTIONS: WBAT  FALLS:  Has patient fallen in last 6 months? No  LIVING ENVIRONMENT: Lives with: Wife Lives in: house  Stairs: 17 stairs to 2nd floor bedroom, no entry stairs  Has following equipment at home: crutches up to 6'6.   OCCUPATION: Retired   PLOF: Coaches basketball   PATIENT GOALS: Full recovery   NEXT MD VISIT:  June 2025   OBJECTIVE:  Note: Objective measures were completed at Evaluation unless otherwise noted.  PATIENT SURVEYS:  LEFS: 20% EDEMA:  Unable to see, still ace wrapped  LOWER EXTREMITY ROM:  A/PROM Right eval Left eval Right  03/20/24 Right  03/25/24 Right  04/02/24  Hip flexion       Hip extension       Hip abduction       Hip adduction       Hip internal rotation       Hip external rotation       Knee flexion 65 degrees  83/90 90 90  Knee extension  5 degrees  1/0 0 0  Knee internal rotation       Knee external rotation       Knee  abduction       Knee adduction       Ankle dorsiflexion       Ankle plantarflexion       Ankle inversion       Ankle eversion        (Blank rows = not tested)  TODAY'S TREATMENT   04/02/24: RLE only   Nu-Step seat at 14 with resistance at 3 for 6 min  Knee Flex R 90  Knee Ext R 0  SLS on RLE on foam  2 x 10 sec  SLS with blaze pods on foam with 4 pods and distraction  -Round 1: 17 -Round 2: 19 -Round 3: 24  Standing Hip Ext 3 x 10  Standing Hip Flex 3 x 10  Standing Hip Abd 3 x 10  Standing Hip Add 3 x 10   Mini-Squat to 70 deg OR 29 inch mat height 2 x 10     PATIENT EDUCATION:  Education details: HEP, stairs training, cutches sizing, polar care use Person educated: Patient and spouse  Education method: collaborative learning, deliberate practice, positive reinforcement, explicit instruction, establish rules. Education comprehension: excellent  HOME EXERCISE PROGRAM: Access Code: [facility] URL: [email or web address] Date: 04/02/2024 Prepared by: Toribio Servant  Exercises - Supine Heel Slide with Strap  - 1 x daily - 7 x weekly - 3 sets - 15 reps - Mini Squat with Chair  - 3-4 x weekly - 3 sets - 10 reps - Standing Hip Abduction with Counter Support  - 3-4 x weekly - 3 sets - 10 reps - Standing Hip Extension with Counter Support  - 3-4 x weekly - 3 sets - 10 reps  ASSESSMENT:  CLINICAL IMPRESSION: Pt is now s/p 5 weeks lateral meniscus repair of right knee. He continues to progress with knee strengthening exercises without an increase in pain and he continues to maintain knee ROM that exceeds rehab timeline goals for 4-6 week post op phase. He will be return to orthopedist this Friday for follow up on right knee. PT continues to reiterate that pt should lock brace into full extension and to not flex knee beyond 90 deg. He will continue to benefit from skilled PT to improve right knee mobility and strength to return to playing walking without deficits and to  playing basketball.    OBJECTIVE IMPAIRMENTS: Decreased knowledge of condition, decreased use of DME, decreased mobility, difficulty walking, decreased strength, decreased ROM. ACTIVITY LIMITATIONS: Lifting, standing, walking, squatting, transfers, locomotion level PARTICIPATION LIMITATIONS: Cleaning, laundry, interpersonal relationships, driving, yardwork, community activity.  PERSONAL FACTORS: Age, behavior pattern, education, past/current experiences, transportation, profession  are also affecting patient's functional outcome.  REHAB POTENTIAL: Great  CLINICAL DECISION  MAKING: Great EVALUATION COMPLEXITY: Low  GOALS: Goals reviewed with patient? yes  SHORT TERM GOALS: Target date: 04/05/24 Patient will report comprehension, confidence, and consistent compliance and of a simple home exercise program established to facilitate symptoms management and basic strengthening and/or segment mobility.  Baseline: issued at eval 03/12/24: Able to perform HEP independently  Goal status: ACHIEVED    2.  Patient to demonstrate improved score on self-report measure by >9% to indicate reduced self-reported disability and/or pain.    Baseline: LEFS: 20%  Goal status: ONGOING   3.  Pt to demonstrate Rt knee RM 0-90 degrees  Baseline: eval: 5-65 deg 03/20/24: 0-83  04/02/24: 0-90 deg on RLE  Goal status: ACHIEVED   LONG TERM GOALS: Target date: 05/06/24  Pt to demonstrate ability to perform >1517ft without pain limitation, less than 2/10 increase in NPRS to improve ability to participate in IADL and social events.  Baseline: 1,200 ft  Goal status: ONGOING  2.  Pt to demonstrate less than 10% strength deficit in between bilat knee extension and bilat ankle PF to prepare for return to leisure and community activity.  Baseline: Not able to load knee at this point in protocol   Goal status: ONGOING    3.  Patient to demonstrate improved performance on initial transfers assessment AEB improved reps  per time or time per perform desired reps and/or reduced seat height and/or use of hands in order to improve safety, tolerance, and independence in ADL performance.   Baseline: 30 sec chair rise deferred  Goal status: ONGOING   4.  Pt to demonstrate <10% difference between single leg hop bilaterally.  Baseline: Not able to perform due to restrictions  Goal status: ONGOING   PLAN:  PT FREQUENCY: 1-2x/week  PT DURATION: 12 weeks PLANNED INTERVENTIONS: 97110-Therapeutic exercises, 97530- Therapeutic activity, 97112- Neuromuscular re-education, [phone]- Self Care, 02859- Manual therapy, [phone]- Gait training, [phone]- Electrical stimulation (unattended), [phone]- Electrical stimulation (manual), Patient/Family education, Balance training, Stair training, Joint mobilization, Joint manipulation, Spinal manipulation, and Spinal mobilization  PLAN FOR NEXT SESSION: Measure knee ROM and short term goals. Long Arch Quads and/pr straight leg raise with ankle weight <=5 lbs. Banded lateral side step and monster walks. Test knee extension strength.    Toribio Servant PT, DPT  [facility] [address], [place], [address] Phone: [phone]   Fax:  [phone]

## 2024-04-03 ENCOUNTER — Ambulatory Visit: Admitting: Physical Therapy

## 2024-04-04 ENCOUNTER — Ambulatory Visit (HOSPITAL_BASED_OUTPATIENT_CLINIC_OR_DEPARTMENT_OTHER): Admitting: Orthopaedic Surgery

## 2024-04-04 DIAGNOSIS — S83271A Complex tear of lateral meniscus, current injury, right knee, initial encounter: Secondary | ICD-10-CM

## 2024-04-04 NOTE — Progress Notes (Signed)
 Post Operative Evaluation    Procedure/Date of Surgery: Right knee lateral meniscal repair 6/3  Interval History:   Presents6 weeks status post the above procedure.  Overall he is continues to do extremely well.  He is now walking without any pain.   PMH/PSH/Family History/Social History/Meds/Allergies:    Past Medical History:  Diagnosis Date   Arthritis    knees, rt shoulder   Hyperlipidemia    Hypertension    MI (myocardial infarction) [facility])    Past Surgical History:  Procedure Laterality Date   CARDIAC CATHETERIZATION     Angioplasty with stent placement x 2   COLONOSCOPY WITH PROPOFOL  N/A 06/01/2017   Procedure: COLONOSCOPY WITH PROPOFOL ;  Surgeon: Therisa Bi, MD;  Location: [facility] ENDOSCOPY;  Service: Gastroenterology;  Laterality: N/A;   KNEE ARTHROSCOPY WITH MENISCAL REPAIR Right 02/26/2024   Procedure: ARTHROSCOPY, KNEE, WITH MENISCUS REPAIR;  Surgeon: Genelle Standing, MD;  Location: [facility];  Service: Orthopedics;  Laterality: Right;  RIGHT KNEE ARTHROSCOPY WITH LATERAL MENISCUS REPAIR   OTHER SURGICAL HISTORY     2 Cardiac stents   TUMOR REMOVAL     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   Social History   Socioeconomic History   Marital status: Married    Spouse name: Not on file   Number of children: Not on file   Years of education: Not on file   Highest education level: Not on file  Occupational History   Not on file  Tobacco Use   Smoking status: Never   Smokeless tobacco: Never  Substance and Sexual Activity   Alcohol use: Yes    Comment: social   Drug use: No   Sexual activity: Yes  Other Topics Concern   Not on file  Social History Narrative   Not on file   Social Drivers of Health   Financial Resource Strain: Low Risk  (10/26/2023)   Received from [facility]   Overall Financial Resource Strain (CARDIA)    Difficulty of Paying Living Expenses: Not hard at all  Food Insecurity: No Food  Insecurity (10/26/2023)   Received from [facility]   Hunger Vital Sign    Within the past 12 months, you worried that your food would run out before you got the money to buy more.: Never true    Within the past 12 months, the food you bought just didn't last and you didn't have money to get more.: Never true  Transportation Needs: No Transportation Needs (10/26/2023)   Received from [facility] - Transportation    In the past 12 months, has lack of transportation kept you from medical appointments or from getting medications?: No    Lack of Transportation (Non-Medical): No  Physical Activity: Not on file  Stress: Not on file  Social Connections: Not on file   No family history on file. Allergies  Allergen Reactions   Oxycodone -Acetaminophen  Nausea And Vomiting   Current Outpatient Medications  Medication Sig Dispense Refill   aspirin  EC 325 MG tablet Take 1 tablet (325 mg total) by mouth daily. 30 tablet 0   lisinopril (PRINIVIL,ZESTRIL) 20 MG tablet Take 10 mg by mouth daily.     Metoprolol Succinate 25 MG CS24 Take 25 mg by mouth daily.     Multiple Vitamin (  MULTIVITAMIN WITH MINERALS) TABS tablet Take 1 tablet by mouth daily.     nitroGLYCERIN (NITROSTAT) 0.4 MG SL tablet Place under the tongue.     oxycodone  (OXY-IR) 5 MG capsule Take 1 capsule (5 mg total) by mouth every 4 (four) hours as needed (severe pain). 10 capsule 0   oxyCODONE  (ROXICODONE ) 5 MG immediate release tablet Take 1 tablet (5 mg total) by mouth every 4 (four) hours as needed for severe pain (pain score 7-10) or breakthrough pain. 10 tablet 0   pravastatin (PRAVACHOL) 20 MG tablet Take 20 mg by mouth at bedtime.     ticagrelor (BRILINTA) 90 MG TABS tablet Take by mouth 2 (two) times daily.     No current facility-administered medications for this visit.   No results found.  Review of Systems:   A ROS was performed including pertinent positives and negatives as  documented in the HPI.   Musculoskeletal Exam:    There were no vitals taken for this visit.  Right knee with well-appearing incision.  Range of motion is from 0 to 120 degrees.  Mildly atrophied quad.  Distal neurosensory exam is intact  Imaging:      I personally reviewed and interpreted the radiographs.   Assessment:   6 weeks status post right knee lateral meniscal repair.  Overall he is doing extremely well.  He is walking without any pain and his strength is coming along nicely.  At this time he may discontinue his brace.  I will plan to see him back in 6 weeks for reassessment  Plan :    - Return to clinic 6 weeks for reassessment      I personally saw and evaluated the patient, and participated in the management and treatment plan.  Elspeth Parker, MD Attending Physician, Orthopedic Surgery  This document was dictated using Dragon voice recognition software. A reasonable attempt at proof reading has been made to minimize errors.

## 2024-04-07 ENCOUNTER — Encounter: Admitting: Physical Therapy

## 2024-04-09 ENCOUNTER — Encounter (HOSPITAL_BASED_OUTPATIENT_CLINIC_OR_DEPARTMENT_OTHER): Admitting: Orthopaedic Surgery

## 2024-04-10 ENCOUNTER — Encounter: Admitting: Physical Therapy

## 2024-04-14 ENCOUNTER — Ambulatory Visit: Admitting: Physical Therapy

## 2024-04-14 DIAGNOSIS — R262 Difficulty in walking, not elsewhere classified: Secondary | ICD-10-CM

## 2024-04-14 DIAGNOSIS — M25661 Stiffness of right knee, not elsewhere classified: Secondary | ICD-10-CM

## 2024-04-14 DIAGNOSIS — M25561 Pain in right knee: Secondary | ICD-10-CM

## 2024-04-14 NOTE — Therapy (Signed)
 OUTPATIENT PHYSICAL THERAPY TREATMENT   Patient Name: Gregory Banks. 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    Authorization Time Period 03/06/24-05/29/24    Authorization - Visit Number 6    Authorization - Number of Visits 24    Progress Note Due on Visit 10    PT Start Time 1650    PT Stop Time 1730    PT Time Calculation (min) 40 min    Activity Tolerance Patient tolerated treatment well;No increased pain    Behavior During Therapy WFL for tasks assessed/performed            Past Medical History:  Diagnosis Date   Arthritis    knees, rt shoulder   Hyperlipidemia    Hypertension    MI (myocardial infarction) [facility])    Past Surgical History:  Procedure Laterality Date   CARDIAC CATHETERIZATION     Angioplasty with stent placement x 2   COLONOSCOPY WITH PROPOFOL  N/A 06/01/2017   Procedure: COLONOSCOPY WITH PROPOFOL ;  Surgeon: Therisa Bi, MD;  Location: [facility] ENDOSCOPY;  Service: Gastroenterology;  Laterality: N/A;   KNEE ARTHROSCOPY WITH MENISCAL REPAIR Right 02/26/2024   Procedure: ARTHROSCOPY, KNEE, WITH MENISCUS REPAIR;  Surgeon: Genelle Standing, MD;  Location: [facility];  Service: Orthopedics;  Laterality: Right;  RIGHT KNEE ARTHROSCOPY WITH LATERAL MENISCUS REPAIR   OTHER SURGICAL HISTORY     2 Cardiac stents   TUMOR REMOVAL     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   Patient Active Problem List   Diagnosis Date Noted   Complex tear of lateral meniscus of right knee as current injury 02/26/2024   Hyperlipidemia    MI (myocardial infarction) (HCC)    Intracranial mass 01/27/2015   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   Anxiety 03/04/2014   Chronic coronary artery disease 03/04/2014   Depression 03/04/2014   Hypertension 03/04/2014    Shortness of breath 03/04/2014   PCP: Epifanio Alm SQUIBB, MD  REFERRING PROVIDER: [name] Genelle, MD (orthopedics)  REFERRING DIAG: s/p Rt latera meniscus repair  THERAPY DIAG:  Acute pain of right knee  Stiffness of right knee, not elsewhere classified  Difficulty in walking, not elsewhere classified  Rationale for Evaluation and Treatment: Rehabilitation  ONSET DATE: 02/26/24  SUBJECTIVE:   SUBJECTIVE STATEMENT: Pt recently saw orthopedist who has cleared him to stop using knee brace.  PERTINENT HISTORY: Knee pain and locking following a day of playing 4 basketball games, meniscus injury seen. Pt underwent lateral meniscus repair with Dr. Genelle on 6/3, placed in locked in extension, WBAT. At evaluation current pain: 6/10; Best pain: 0/10 Worst Pain: 6/10, on average has been around 4 PAIN:  Are you having pain? 6/10 today, he tweaked his knee getting off the couch today.  PRECAUTIONS: WBAT, locked brace at all times when walking.  WEIGHT BEARING RESTRICTIONS: WBAT  FALLS:  Has patient fallen in last 6 months? No  LIVING ENVIRONMENT: Lives with: Wife Lives in: house  Stairs: 17 stairs to 2nd floor bedroom, no entry stairs  Has following equipment at home: crutches up to 6'6.   OCCUPATION: Retired   PLOF: Coaches basketball   PATIENT GOALS: Full recovery   NEXT MD VISIT: June 2025   OBJECTIVE:  Note: Objective measures  were completed at Evaluation unless otherwise noted.  PATIENT SURVEYS:  LEFS: 20% EDEMA:  Unable to see, still ace wrapped  LOWER EXTREMITY ROM:  A/PROM Right eval Left eval Right  03/20/24 Right  03/25/24 Right  04/02/24 Right 04/14/24  Hip flexion        Hip extension        Hip abduction        Hip adduction        Hip internal rotation        Hip external rotation        Knee flexion 65 degrees  83/90 90 90 105 /NT   Knee extension  5 degrees  1/0 0 0   Knee internal rotation        Knee external rotation        Knee abduction         Knee adduction        Ankle dorsiflexion        Ankle plantarflexion        Ankle inversion        Ankle eversion         (Blank rows = not tested)  TODAY'S TREATMENT   04/14/24: RLE only   Matrix recumbent bike with seat 23 with half rotations for 5 min   OMEGA Leg Press #75 1 x 10   OMEGA Leg Press #105 2 x 10  OMEGA Knee Extension #25 3 x 10   OMEGA HS Curls #25 3 X 10   Knee Flex AROM 105 deg   Standing Knee Flexion on 12 inch step on RLE 2 x 10  TRX Squat with BUE support 2 x 10   -Pt able to reach 90 deg knee flexion    PATIENT EDUCATION:  Education details: HEP, stairs training, cutches sizing, polar care use Person educated: Patient and spouse  Education method: collaborative learning, deliberate practice, positive reinforcement, explicit instruction, establish rules. Education comprehension: excellent  HOME EXERCISE PROGRAM: Access Code: [facility] URL: [email or web address] Date: 04/14/2024 Prepared by: Toribio Servant  Exercises - Supine Heel Slide with Strap  - 1 x daily - 7 x weekly - 3 sets - 15 reps - Standing Knee Flexion Stretch on Step  - 1 x daily - 7 x weekly - 2 sets - 10 reps - 2-3 sec  hold - Standing Hip Abduction with Counter Support  - 3-4 x weekly - 3 sets - 10 reps - Squat with TRX  - 3-4 x weekly - 3 sets - 10 reps ASSESSMENT:  CLINICAL IMPRESSION: Pt is now s/p 7 weeks lateral meniscus repair of right knee. He continues to progress towards goals with improvement in right knee ROM. He is still limited with knee flexion as evidenced by in ability to perform full rotation on bicycle. He will continue to benefit from skilled PT to improve right knee mobility and strength to return to playing walking without deficits and to playing basketball.    OBJECTIVE IMPAIRMENTS: Decreased knowledge of condition, decreased use of DME, decreased mobility, difficulty walking, decreased strength, decreased ROM. ACTIVITY LIMITATIONS: Lifting,  standing, walking, squatting, transfers, locomotion level PARTICIPATION LIMITATIONS: Cleaning, laundry, interpersonal relationships, driving, yardwork, community activity.  PERSONAL FACTORS: Age, behavior pattern, education, past/current experiences, transportation, profession  are also affecting patient's functional outcome.  REHAB POTENTIAL: Great  CLINICAL DECISION MAKING: Great EVALUATION COMPLEXITY: Low  GOALS: Goals reviewed with patient? yes  SHORT TERM GOALS: Target date: 04/05/24 Patient will report comprehension, confidence, and consistent compliance  and of a simple home exercise program established to facilitate symptoms management and basic strengthening and/or segment mobility.  Baseline: issued at eval 03/12/24: Able to perform HEP independently  Goal status: ACHIEVED    2.  Patient to demonstrate improved score on self-report measure by >9% to indicate reduced self-reported disability and/or pain.    Baseline: LEFS: 20%  Goal status: ONGOING   3.  Pt to demonstrate Rt knee RM 0-90 degrees  Baseline: eval: 5-65 deg 03/20/24: 0-83  04/02/24: 0-90 deg on RLE  Goal status: ACHIEVED   LONG TERM GOALS: Target date: 05/06/24  Pt to demonstrate ability to perform >1529ft without pain limitation, less than 2/10 increase in NPRS to improve ability to participate in IADL and social events.  Baseline: 1,200 ft  Goal status: ONGOING  2.  Pt to demonstrate less than 10% strength deficit in between bilat knee extension and bilat ankle PF to prepare for return to leisure and community activity.  Baseline: Not able to load knee at this point in protocol   Goal status: ONGOING    3.  Patient to demonstrate improved performance on initial transfers assessment AEB improved reps per time or time per perform desired reps and/or reduced seat height and/or use of hands in order to improve safety, tolerance, and independence in ADL performance.   Baseline: 30 sec chair rise deferred  Goal  status: ONGOING   4.  Pt to demonstrate <10% difference between single leg hop bilaterally.  Baseline: Not able to perform due to restrictions  Goal status: ONGOING   PLAN:  PT FREQUENCY: 1-2x/week  PT DURATION: 12 weeks PLANNED INTERVENTIONS: 97110-Therapeutic exercises, 97530- Therapeutic activity, 97112- Neuromuscular re-education, 97535- Self Care, 02859- Manual therapy, [phone]- Gait training, [phone]- Electrical stimulation (unattended), [phone]- Electrical stimulation (manual), Patient/Family education, Balance training, Stair training, Joint mobilization, Joint manipulation, Spinal manipulation, and Spinal mobilization  PLAN FOR NEXT SESSION: Take LEFS again. Measure knee ROM and short term goals. Progress knee flexion ROM and strengthening exercies: sled push and step ups.    Toribio Servant PT, DPT  [facility] [address] [place], [address] Phone: [phone]   Fax:  [phone]

## 2024-04-17 ENCOUNTER — Ambulatory Visit: Admitting: Physical Therapy

## 2024-04-17 DIAGNOSIS — M25661 Stiffness of right knee, not elsewhere classified: Secondary | ICD-10-CM

## 2024-04-17 DIAGNOSIS — R262 Difficulty in walking, not elsewhere classified: Secondary | ICD-10-CM

## 2024-04-17 DIAGNOSIS — M25561 Pain in right knee: Secondary | ICD-10-CM

## 2024-04-17 NOTE — Therapy (Signed)
OUTPATIENT PHYSICAL THERAPY TREATMENT   Patient Name: Gregory Banks. MRN: [id number] DOB:12-Jul-1966, 58 y.o., male Today's Date: 04/17/2024  END OF SESSION:  PT End of Session - 04/17/24 1439     Visit Number 7    Number of Visits 24    Date for PT Re-Evaluation 05/29/24    Authorization Type Medicare primary; [employer]ndary    Authorization Time Period 03/06/24-05/29/24    Authorization - Visit Number 7    Authorization - Number of Visits 24    Progress Note Due on Visit 10    PT Start Time 1435    PT Stop Time 1515    PT Time Calculation (min) 40 min    Activity Tolerance Patient tolerated treatment well;No increased pain    Behavior During Therapy WFL for tasks assessed/performed            Past Medical History:  Diagnosis Date   Arthritis    knees, rt shoulder   Hyperlipidemia    Hypertension    MI (myocardial infarction) Gregory Banks)    Past Surgical History:  Procedure Laterality Date   CARDIAC CATHETERIZATION     Angioplasty with stent placement x 2   COLONOSCOPY WITH PROPOFOL  N/A 06/01/2017   Procedure: COLONOSCOPY WITH PROPOFOL ;  Surgeon: Therisa Bi, MD;  Location: [facility] ENDOSCOPY;  Service: Gastroenterology;  Laterality: N/A;   KNEE ARTHROSCOPY WITH MENISCAL REPAIR Right 02/26/2024   Procedure: ARTHROSCOPY, KNEE, WITH MENISCUS REPAIR;  Surgeon: Genelle Standing, MD;  Location: [facility];  Service: Orthopedics;  Laterality: Right;  RIGHT KNEE ARTHROSCOPY WITH LATERAL MENISCUS REPAIR   OTHER SURGICAL HISTORY     2 Cardiac stents   TUMOR REMOVAL     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   Patient Active Problem List   Diagnosis Date Noted   Complex tear of lateral meniscus of right knee as current injury 02/26/2024   Hyperlipidemia    MI (myocardial infarction) (HCC)    Intracranial mass 01/27/2015   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   Anxiety 03/04/2014   Chronic coronary artery disease 03/04/2014   Depression 03/04/2014   Hypertension 03/04/2014    Shortness of breath 03/04/2014   PCP: Epifanio Alm SQUIBB, MD  REFERRING PROVIDER: [name] Genelle, MD (orthopedics)  REFERRING DIAG: s/p Rt latera meniscus repair  THERAPY DIAG:  Acute pain of right knee  Stiffness of right knee, not elsewhere classified  Difficulty in walking, not elsewhere classified  Rationale for Evaluation and Treatment: Rehabilitation  ONSET DATE: 02/26/24  SUBJECTIVE:   SUBJECTIVE STATEMENT: Pt reports increased right hip soreness after last session especially when sitting for long periods.  He also has been walking longer distances.  PERTINENT HISTORY: Knee pain and locking following a day of playing 4 basketball games, meniscus injury seen. Pt underwent lateral meniscus repair with Dr. Genelle on 6/3, placed in locked in extension, WBAT. At evaluation current pain: 6/10; Best pain: 0/10 Worst Pain: 6/10, on average has been around 4 PAIN:  Are you having pain? 6/10 today, he tweaked his knee getting off the couch today.  PRECAUTIONS: WBAT, locked brace at all times when walking.  WEIGHT BEARING RESTRICTIONS: WBAT  FALLS:  Has patient fallen in last 6 months? No  LIVING ENVIRONMENT: Lives with: Wife Lives in: house  Stairs: 17 stairs to 2nd floor bedroom, no entry stairs  Has following equipment at home: crutches up to 6'6.   OCCUPATION: Retired   PLOF: Coaches basketball   PATIENT GOALS: Full recovery   NEXT MD  VISIT: June 2025   OBJECTIVE:  Note: Objective measures were completed at Evaluation unless otherwise noted.  PATIENT SURVEYS:  LEFS: 20% 04/17/24: 78% (63/80) EDEMA:  Unable to see, still ace wrapped  LOWER EXTREMITY ROM:  A/PROM Right eval Left eval Right  03/20/24 Right  03/25/24 Right  04/02/24 Right 04/14/24 Right  04/17/24  Hip flexion         Hip extension         Hip abduction         Hip adduction         Hip internal rotation         Hip external rotation         Knee flexion 65 degrees  83/90 90 90 105 /NT  115/NT    Knee extension  5 degrees  1/0 0 0    Knee internal rotation         Knee external rotation         Knee abduction         Knee adduction         Ankle dorsiflexion         Ankle plantarflexion         Ankle inversion         Ankle eversion          (Blank rows = not tested)  TODAY'S TREATMENT   04/17/24:   THEREX   Seated Matrix seat at 23  with half rotations for 5 min  Straight leg raise 90 deg on RLE   -No concordant pain elicited  Seated ER Stretch 2 x 30 sec  -Pt reports a stretch lateral right hip   Step Up on 6 inch step 3 x 10   Double Limb heel raise on 6 inch step 1 x 10  Single Leg Heel Raise on RLE  3 x 10   -Pt unable to raise up on heel to sufficient height  Heel Raise in lunge with left foot on step  1 x 10   -min VC to maintain a straight knee LEFS 78% or 63/80  SELF CARE HOME MANAGEMENT   How to safely negotiate steps with post surgical limb  -Up with the good and down with the bad    Tissue healing timeline for meniscal injuries including print off of time line that was provided to patient.    PATIENT EDUCATION:  Education details: Tissue healing timeline Person educated: Patient and spouse  Education method: collaborative learning, deliberate practice, positive reinforcement, explicit instruction, establish rules. Education comprehension: excellent  HOME EXERCISE PROGRAM: Access Code: [facility] URL: [email or web address] Date: 04/17/2024 Prepared by: Toribio Servant  Program Notes Lunge heel raise with left foot on step  3 x 10 3-4 DAYS PER WEEK; keep knee straight   Exercises - Supine Heel Slide with Strap  - 1 x daily - 7 x weekly - 3 sets - 15 reps - Standing Knee Flexion Stretch on Step  - 1 x daily - 7 x weekly - 2 sets - 10 reps - 2-3 sec  hold - Standing Hip Abduction with Counter Support  - 3-4 x weekly - 3 sets - 10 reps - Squat with TRX  - 3-4 x weekly - 3 sets - 10 reps - Step Up  - 3-4 x weekly - 3 sets - 10 reps -  Seated Hip External Rotation Stretch  - 1 x daily - 7 x weekly - 3 reps - 60 sec  hold  ASSESSMENT:  CLINICAL IMPRESSION: Pt is now s/p 7.5 weeks lateral meniscus repair of right knee. He continues to progress towards goals with improvement in right knee ROM. He also shows ongoing decreased pain and improvement in strength with ability to perform step ups. PT educated pt on tissue healing time and that cartilaginous tissue like meniscus tend to take longer to heal because of poor blood supply. Pt's pain in right hip is likely from delayed onset muscle soreness of glute med due to ongoing strength demands from increased weight bearing activity. He will continue to benefit from skilled PT to improve right knee mobility and strength to return to playing walking without deficits and to playing basketball.   OBJECTIVE IMPAIRMENTS:  Decreased knowledge of condition, decreased use of DME, decreased mobility, difficulty walking, decreased strength, decreased ROM. ACTIVITY LIMITATIONS: Lifting, standing, walking, squatting, transfers, locomotion level PARTICIPATION LIMITATIONS: Cleaning, laundry, interpersonal relationships, driving, yardwork, community activity.  PERSONAL FACTORS: Age, behavior pattern, education, past/current experiences, transportation, profession  are also affecting patient's functional outcome.  REHAB POTENTIAL: Great  CLINICAL DECISION MAKING: Great EVALUATION COMPLEXITY: Low  GOALS: Goals reviewed with patient? yes  SHORT TERM GOALS: Target date: 04/05/24 Patient will report comprehension, confidence, and consistent compliance and of a simple home exercise program established to facilitate symptoms management and basic strengthening and/or segment mobility.  Baseline: issued at eval 03/12/24: Able to perform HEP independently  Goal status: ACHIEVED    2.  Patient to demonstrate improved score on self-report measure by >9% to indicate reduced self-reported disability and/or  pain.    Baseline: LEFS: 20%   04/17/24: 78% Goal status: ACHIEVED  3.  Pt to demonstrate Rt knee RM 0-90 degrees  Baseline: eval: 5-65 deg 03/20/24: 0-83  04/02/24: 0-90 deg on RLE  Goal status: ACHIEVED   LONG TERM GOALS: Target date: 05/06/24  Pt to demonstrate ability to perform >1558ft without pain limitation, less than 2/10 increase in NPRS to improve ability to participate in IADL and social events.  Baseline: 1,200 ft  Goal status: ONGOING  2.  Pt to demonstrate less than 10% strength deficit in between bilat knee extension and bilat ankle PF to prepare for return to leisure and community activity.  Baseline: Not able to load knee at this point in protocol   Goal status: ONGOING    3.  Patient to demonstrate improved performance on initial transfers assessment AEB improved reps per time or time per perform desired reps and/or reduced seat height and/or use of hands in order to improve safety, tolerance, and independence in ADL performance.   Baseline: 30 sec chair rise deferred  Goal status: ONGOING   4.  Pt to demonstrate <10% difference between single leg hop bilaterally.  Baseline: Not able to perform due to restrictions  Goal status: ONGOING   PLAN:  PT FREQUENCY: 1-2x/week  PT DURATION: 12 weeks PLANNED INTERVENTIONS: 97110-Therapeutic exercises, 97530- Therapeutic activity, 97112- Neuromuscular re-education, 97535- Self Care, 02859- Manual therapy, [phone]- Gait training, [phone]- Electrical stimulation (unattended), [phone]- Electrical stimulation (manual), Patient/Family education, Balance training, Stair training, Joint mobilization, Joint manipulation, Spinal manipulation, and Spinal mobilization  PLAN FOR NEXT SESSION: Progress knee flexion ROM and strengthening exercies: sled push. Supine knee flexion mobility exercise and standing up while scooting forward for increased knee flexion. Single Leg Press and lunges.      Toribio Servant PT, DPT  [facility] [address], [place], [address] Phone: [phone]   Fax:  [phone]   

## 2024-04-21 ENCOUNTER — Ambulatory Visit: Admitting: Physical Therapy

## 2024-04-23 ENCOUNTER — Ambulatory Visit: Admitting: Physical Therapy

## 2024-04-23 DIAGNOSIS — M25661 Stiffness of right knee, not elsewhere classified: Secondary | ICD-10-CM | POA: Diagnosis not present

## 2024-04-23 DIAGNOSIS — R262 Difficulty in walking, not elsewhere classified: Secondary | ICD-10-CM

## 2024-04-23 DIAGNOSIS — M25561 Pain in right knee: Secondary | ICD-10-CM

## 2024-04-23 NOTE — Therapy (Signed)
 OUTPATIENT PHYSICAL THERAPY TREATMENT   Patient Name: Gregory Banks. MRN: [id number] DOB:09/02/1966, 58 y.o., male Today's Date: 04/23/2024  END OF SESSION:  PT End of Session - 04/23/24 1037     Visit Number 8    Number of Visits 24    Date for PT Re-Evaluation 05/29/24    Authorization Type Medicare primary; [employer] 2ndary    Authorization Time Period 03/06/24-05/29/24    Authorization - Visit Number 8    Authorization - Number of Visits 24    Progress Note Due on Visit 10    PT Start Time 1035    PT Stop Time 1115    PT Time Calculation (min) 40 min    Activity Tolerance Patient tolerated treatment well;No increased pain    Behavior During Therapy WFL for tasks assessed/performed            Past Medical History:  Diagnosis Date   Arthritis    knees, rt shoulder   Hyperlipidemia    Hypertension    MI (myocardial infarction) [facility])    Past Surgical History:  Procedure Laterality Date   CARDIAC CATHETERIZATION     Angioplasty with stent placement x 2   COLONOSCOPY WITH PROPOFOL  N/A 06/01/2017   Procedure: COLONOSCOPY WITH PROPOFOL ;  Surgeon: Therisa Bi, MD;  Location: 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   Patient Active Problem List   Diagnosis Date Noted   Complex tear of lateral meniscus of right knee as current injury 02/26/2024   Hyperlipidemia    MI (myocardial infarction) (HCC)    Intracranial mass 01/27/2015   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   Anxiety 03/04/2014   Chronic coronary artery disease 03/04/2014   Depression 03/04/2014   Hypertension 03/04/2014    Shortness of breath 03/04/2014   PCP: Epifanio Alm SQUIBB, MD  REFERRING PROVIDER: [name] Genelle, MD (orthopedics)  REFERRING DIAG: s/p Rt latera meniscus repair  THERAPY DIAG:  Acute pain of right knee  Stiffness of right knee, not elsewhere classified  Difficulty in walking, not elsewhere classified  Rationale for Evaluation and Treatment: Rehabilitation  ONSET DATE: 02/26/24  SUBJECTIVE:   SUBJECTIVE STATEMENT: Pt reports that he continues to see improvement in his right knee and only experiences pain when bending knee and he also hears clicking.   PERTINENT HISTORY: Knee pain and locking following a day of playing 4 basketball games, meniscus injury seen. Pt underwent lateral meniscus repair with Dr. Genelle on 6/3, placed in locked in extension, WBAT. At evaluation current pain: 6/10; Best pain: 0/10 Worst Pain: 6/10, on average has been around 4 PAIN:  Are you having pain? 6/10 today, he tweaked his knee getting off the couch today.  PRECAUTIONS: WBAT, locked brace at all times when walking.  WEIGHT BEARING RESTRICTIONS: WBAT  FALLS:  Has patient fallen in last 6 months? No  LIVING ENVIRONMENT: Lives with: Wife Lives in: house  Stairs: 17 stairs to 2nd floor bedroom, no entry stairs  Has following equipment at home: crutches up to 6'6.   OCCUPATION: Retired   PLOF: Coaches basketball   PATIENT GOALS: Full recovery  NEXT MD VISIT: June 2025   OBJECTIVE:  Note: Objective measures were completed at Evaluation unless otherwise noted.  PATIENT SURVEYS:  LEFS: 20% 04/17/24: 78% (63/80) EDEMA:  Unable to see, still ace wrapped  LOWER EXTREMITY ROM:  A/PROM Right eval Left eval Right  03/25/24 Right  04/02/24 Right 04/14/24 Right  04/17/24 Right   04/23/24   Hip flexion         Hip extension         Hip abduction         Hip adduction         Hip internal rotation         Hip external rotation         Knee flexion 65 degrees  90 90 105 /NT  115/NT  115/NT    Knee extension  5 degrees  0 0     Knee internal rotation         Knee external rotation         Knee abduction         Knee adduction         Ankle dorsiflexion         Ankle plantarflexion         Ankle inversion         Ankle eversion          (Blank rows = not tested)         TODAY'S TREATMENT   04/23/24:  THEREX   Seated Matrix seat at 23 with half rotations for 2.5 min forward and 2.5 min backward    OMEGA Leg Press #105 1 x 10   OMEGA Leg Press #125 1 x 10   OMEGA Leg Press #145 1 x 10   Knee ROM (See Above ) Prone Quad Stretch 2 x 60 sec    Knee Flexion Extension Wall Walks with knee flexion 5 sec hold 2 x 10      PATIENT EDUCATION:  Education details: Tissue healing timeline Person educated: Patient and spouse  Education method: collaborative learning, deliberate practice, positive reinforcement, explicit instruction, establish rules. Education comprehension: excellent  HOME EXERCISE PROGRAM: Access Code: [facility] URL: [email or web address] Date: 04/23/2024 Prepared by: Toribio Servant  Program Notes Lunge heel raise with left foot on step  3 x 10 3-4 DAYS PER WEEK; keep knee straight   Exercises - Supine Heel Slide with Strap  - 1 x daily - 7 x weekly - 3 sets - 15 reps - Knee Flexion Extension Walk at Wall  - 1 x daily - 7 x weekly - 2 sets - 10 reps - 3 sec hold - Standing Knee Flexion Stretch on Step  - 1 x daily - 7 x weekly - 2 sets - 10 reps - 2-3 sec  hold - Prone Quadriceps Stretch with Strap  - 1 x daily - 7 x weekly - 3 reps - 60 sec  hold - Seated Hip External Rotation Stretch  - 1 x daily - 7 x weekly - 3 reps - 60 sec  hold - Standing Hip Abduction with Counter Support  - 3-4 x weekly - 3 sets - 10 reps - Squat with TRX  - 3-4 x weekly - 3 sets - 10 reps - Step Up  - 3-4 x weekly - 3 sets - 10 reps  ASSESSMENT:  CLINICAL IMPRESSION: Pt is now s/p 8 weeks lateral meniscus repair of right knee. He demonstrates improvement  in LE strength as evidenced  by ability to perform strengthening with increased resistance. His knee flexion ROM remains unchanged from last session, but it is still well within progress for rehab protocol timeline. He will continue to benefit from skilled PT to improve right knee mobility and strength to return to playing walking without deficits and to playing basketball.    OBJECTIVE IMPAIRMENTS:  Decreased knowledge of condition, decreased use of DME, decreased mobility, difficulty walking, decreased strength, decreased ROM. ACTIVITY LIMITATIONS: Lifting, standing, walking, squatting, transfers, locomotion level PARTICIPATION LIMITATIONS: Cleaning, laundry, interpersonal relationships, driving, yardwork, community activity.  PERSONAL FACTORS: Age, behavior pattern, education, past/current experiences, transportation, profession  are also affecting patient's functional outcome.  REHAB POTENTIAL: Great  CLINICAL DECISION MAKING: Great EVALUATION COMPLEXITY: Low  GOALS: Goals reviewed with patient? yes  SHORT TERM GOALS: Target date: 04/05/24 Patient will report comprehension, confidence, and consistent compliance and of a simple home exercise program established to facilitate symptoms management and basic strengthening and/or segment mobility.  Baseline: issued at eval 03/12/24: Able to perform HEP independently  Goal status: ACHIEVED    2.  Patient to demonstrate improved score on self-report measure by >9% to indicate reduced self-reported disability and/or pain.    Baseline: LEFS: 20%   04/17/24: 78% Goal status: ACHIEVED  3.  Pt to demonstrate Rt knee RM 0-90 degrees  Baseline: eval: 5-65 deg 03/20/24: 0-83  04/02/24: 0-90 deg on RLE  Goal status: ACHIEVED   LONG TERM GOALS: Target date: 05/06/24  Pt to demonstrate ability to perform >1526ft without pain limitation, less than 2/10 increase in NPRS to improve ability to participate in IADL and social events.  Baseline: 1,200 ft   Goal status: ONGOING  2.  Pt to demonstrate less than 10% strength deficit in between bilat knee extension and bilat ankle PF to prepare for return to leisure and community activity.  Baseline: Not able to load knee at this point in protocol   Goal status: ONGOING    3.  Patient to demonstrate improved performance on initial transfers assessment AEB improved reps per time or time per perform desired reps and/or reduced seat height and/or use of hands in order to improve safety, tolerance, and independence in ADL performance.   Baseline: 30 sec chair rise deferred  Goal status: ONGOING   4.  Pt to demonstrate <10% difference between single leg hop bilaterally.  Baseline: Not able to perform due to restrictions  Goal status: ONGOING   PLAN:  PT FREQUENCY: 1-2x/week  PT DURATION: 12 weeks PLANNED INTERVENTIONS: 97110-Therapeutic exercises, 97530- Therapeutic activity, 97112- Neuromuscular re-education, 97535- Self Care, 02859- Manual therapy, [phone]- Gait training, [phone]- Electrical stimulation (unattended), [phone]- Electrical stimulation (manual), Patient/Family education, Balance training, Stair training, Joint mobilization, Joint manipulation, Spinal manipulation, and Spinal mobilization  PLAN FOR NEXT SESSION: Matrix hip 4 way machine: hip flexion strengthening. Side step down.Progress knee flexion ROM and strengthening exercies: sled push. Supine knee flexion mobility exercise and standing up while scooting forward for increased knee flexion. Attempt lunges     Toribio Servant PT, DPT  [facility] [address], [place], [address] Phone: [phone]   Fax:  [phone]

## 2024-04-24 ENCOUNTER — Ambulatory Visit: Admitting: Physical Therapy

## 2024-04-24 ENCOUNTER — Telehealth: Payer: Self-pay | Admitting: Physical Therapy

## 2024-04-24 NOTE — Telephone Encounter (Signed)
 Called pt to inquire about absence but did not reach so left VM instructing pt about his next apt and that next no show would result in only being able to schedule week to week.

## 2024-04-28 ENCOUNTER — Ambulatory Visit: Attending: Orthopaedic Surgery | Admitting: Physical Therapy

## 2024-04-28 DIAGNOSIS — M25561 Pain in right knee: Secondary | ICD-10-CM | POA: Insufficient documentation

## 2024-04-28 DIAGNOSIS — R262 Difficulty in walking, not elsewhere classified: Secondary | ICD-10-CM | POA: Diagnosis present

## 2024-04-28 DIAGNOSIS — M25661 Stiffness of right knee, not elsewhere classified: Secondary | ICD-10-CM | POA: Insufficient documentation

## 2024-04-28 NOTE — Therapy (Addendum)
 OUTPATIENT PHYSICAL THERAPY TREATMENT   Patient Name: Gregory Banks. MRN: [id number] DOB:1966-03-23, 58 y.o., male Today's Date: 04/28/2024  END OF SESSION:  PT End of Session - 04/28/24 1824     Visit Number 9    Number of Visits 24    Date for PT Re-Evaluation 05/29/24    Authorization Type Medicare primary; [employer] commercial 2ndary    Authorization Time Period 03/06/24-05/29/24    Authorization - Visit Number 9    Authorization - Number of Visits 24    Progress Note Due on Visit 10    PT Start Time 1820    PT Stop Time 1900    PT Time Calculation (min) 40 min    Activity Tolerance Patient tolerated treatment well;No increased pain    Behavior During Therapy WFL for tasks assessed/performed            Past Medical History:  Diagnosis Date   Arthritis    knees, rt shoulder   Hyperlipidemia    Hypertension    MI (myocardial infarction) [facility])    Past Surgical History:  Procedure Laterality Date   CARDIAC CATHETERIZATION     Angioplasty with stent placement x 2   COLONOSCOPY WITH PROPOFOL  N/A 06/01/2017   Procedure: COLONOSCOPY WITH PROPOFOL ;  Surgeon: Therisa Bi, MD;  Location: [facility] ENDOSCOPY;  Service: Gastroenterology;  Laterality: N/A;   KNEE ARTHROSCOPY WITH MENISCAL REPAIR Right 02/26/2024   Procedure: ARTHROSCOPY, KNEE, WITH MENISCUS REPAIR;  Surgeon: Genelle Standing, MD;  Location: [facility];  Service: Orthopedics;  Laterality: Right;  RIGHT KNEE ARTHROSCOPY WITH LATERAL MENISCUS REPAIR   OTHER SURGICAL HISTORY     2 Cardiac stents   TUMOR REMOVAL     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   Patient Active Problem List   Diagnosis Date Noted   Complex tear of lateral meniscus of right knee as current injury 02/26/2024   Hyperlipidemia    MI (myocardial infarction) (HCC)    Intracranial mass 01/27/2015   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   Anxiety 03/04/2014   Chronic coronary artery disease 03/04/2014   Depression 03/04/2014   Hypertension 03/04/2014   Shortness  of breath 03/04/2014   PCP: Epifanio Alm SQUIBB, MD  REFERRING PROVIDER: [name] Genelle, MD (orthopedics)  REFERRING DIAG: s/p Rt latera meniscus repair  THERAPY DIAG:  Acute pain of right knee  Stiffness of right knee, not elsewhere classified  Difficulty in walking, not elsewhere classified  Rationale for Evaluation and Treatment: Rehabilitation  ONSET DATE: 02/26/24  SUBJECTIVE:   SUBJECTIVE STATEMENT: Pt states that he continues to feel some right hip pain and right knee stiffness exercises.    PERTINENT HISTORY: Knee pain and locking following a day of playing 4 basketball games, meniscus injury seen. Pt underwent lateral meniscus repair with Dr. Genelle on 6/3, placed in locked in extension, WBAT. At evaluation current pain: 6/10; Best pain: 0/10 Worst Pain: 6/10, on average has been around 4 PAIN:  Are you having pain? 6/10 today, he tweaked his knee getting off the couch today.  PRECAUTIONS: WBAT, locked brace at all times when walking.  WEIGHT BEARING RESTRICTIONS: WBAT  FALLS:  Has patient fallen in last 6 months? No  LIVING ENVIRONMENT: Lives with: Wife Lives in: house  Stairs: 17 stairs to 2nd floor bedroom, no entry stairs  Has following equipment at home: crutches up to 6'6.   OCCUPATION: Retired   PLOF: Coaches basketball   PATIENT GOALS: Full recovery   NEXT MD VISIT: June 2025  OBJECTIVE:  Note: Objective measures were completed at Evaluation unless otherwise noted.  PATIENT SURVEYS:  LEFS: 20% 04/17/24: 78% (63/80) EDEMA:  Unable to see, still ace wrapped  LOWER EXTREMITY ROM:  A/PROM Right eval Left eval Right 04/14/24 Right  04/17/24 Right   04/23/24  Right  04/28/24  Hip flexion        Hip extension        Hip abduction        Hip adduction        Hip internal rotation        Hip external rotation        Knee flexion 65 degrees  105 /NT  115/NT  115/NT  117/NT   Knee extension  5 degrees       Knee internal rotation        Knee  external rotation        Knee abduction        Knee adduction        Ankle dorsiflexion        Ankle plantarflexion        Ankle inversion        Ankle eversion         (Blank rows = not tested)         TODAY'S TREATMENT   04/28/24: THEREX   Seated Matrix Bike 23 with half rotations for 2.5 min forward and 2.5 min backward    Matrix Hip Flexion 40 lbs 1 x 10  Matrix Hip Flexion 55 lbs 1 x 10   Matrix Hip Flexion 70 lbs 1 x 10   Matrix Hip Abduction 55 lbs 2 x 10  OMEGA Leg Press #35 1 x 10   OMEGA Leg Press #45 2 x 10 OMEGA Knee Extension with overpressure into knee flexion  #25 3 x 10   -min VC to allow knee reach full flexion   Knee Flex AROM R 117   Knee Flexion Scoots 1 x 10  with 10 sec hold at terminal knee flexion   -min VC for right foot placement     PATIENT EDUCATION:  Education details: Tissue healing timeline Person educated: Patient and spouse  Education method: Research scientist (medical), deliberate practice, positive reinforcement, explicit instruction, establish rules. Education comprehension: excellent  HOME EXERCISE PROGRAM: Access Code: [facility] URL: [email or web address] Date: 04/28/2024 Prepared by: Toribio Servant  Program Notes Lunge heel raise with left foot on step  3 x 10 3-4 DAYS PER WEEK; keep knee straight   Exercises - Supine Heel Slide with Strap  - 1 x daily - 7 x weekly - 3 sets - 15 reps - Seated Knee Flexion at Wall  - 1 x daily - 7 x weekly - 1 sets - 10 reps - 30 sec  hold - Knee Flexion Extension Walk at Wall  - 1 x daily - 7 x weekly - 2 sets - 10 reps - 3 sec hold - Standing Knee Flexion Stretch on Step  - 1 x daily - 7 x weekly - 2 sets - 10 reps - 2-3 sec  hold - Prone Quadriceps Stretch with Strap  - 1 x daily - 7 x weekly - 3 reps - 60 sec  hold - Seated Hip External Rotation Stretch  - 1 x daily - 7 x weekly - 3 reps - 60 sec  hold - Standing Hip Abduction with Counter Support  - 3-4 x weekly - 3 sets - 10  reps - Squat with  TRX  - 3-4 x weekly - 3 sets - 10 reps - Step Up  - 3-4 x weekly - 3 sets - 10 reps  ASSESSMENT:  CLINICAL IMPRESSION: Pt is now s/p 9 weeks lateral meniscus repair of right knee. He is progressing towards goals with improvement in right knee ROM and strength. He is now performing single leg resistance exercises. Pt continues to be   He demonstrates improvement in LE strength as evidenced by ability to perform strengthening with increased resistance. His knee flexion ROM remains unchanged from last session, but it is still well within progress for rehab protocol timeline. He will continue to benefit from skilled PT to improve right knee mobility and strength to return to playing walking without deficits and to playing basketball.    OBJECTIVE IMPAIRMENTS:  Decreased knowledge of condition, decreased use of DME, decreased mobility, difficulty walking, decreased strength, decreased ROM. ACTIVITY LIMITATIONS: Lifting, standing, walking, squatting, transfers, locomotion level PARTICIPATION LIMITATIONS: Cleaning, laundry, interpersonal relationships, driving, yardwork, community activity.  PERSONAL FACTORS: Age, behavior pattern, education, past/current experiences, transportation, profession  are also affecting patient's functional outcome.  REHAB POTENTIAL: Great  CLINICAL DECISION MAKING: Great EVALUATION COMPLEXITY: Low  GOALS: Goals reviewed with patient? yes  SHORT TERM GOALS: Target date: 04/05/24 Patient will report comprehension, confidence, and consistent compliance and of a simple home exercise program established to facilitate symptoms management and basic strengthening and/or segment mobility.  Baseline: issued at eval 03/12/24: Able to perform HEP independently  Goal status: ACHIEVED    2.  Patient to demonstrate improved score on self-report measure by >9% to indicate reduced self-reported disability and/or pain.    Baseline: LEFS: 20%   04/17/24: 78% Goal  status: ACHIEVED  3.  Pt to demonstrate Rt knee RM 0-90 degrees  Baseline: eval: 5-65 deg 03/20/24: 0-83  04/02/24: 0-90 deg on RLE  Goal status: ACHIEVED   LONG TERM GOALS: Target date: 05/06/24  Pt to demonstrate ability to perform >1553ft without pain limitation, less than 2/10 increase in NPRS to improve ability to participate in IADL and social events.  Baseline: 1,200 ft  Goal status: ONGOING  2.  Pt to demonstrate less than 10% strength deficit in between bilat knee extension and bilat ankle PF to prepare for return to leisure and community activity.  Baseline: Not able to load knee at this point in protocol   Goal status: ONGOING    3.  Patient to demonstrate improved performance on initial transfers assessment AEB improved reps per time or time per perform desired reps and/or reduced seat height and/or use of hands in order to improve safety, tolerance, and independence in ADL performance.   Baseline: 30 sec chair rise deferred  Goal status: ONGOING   4.  Pt to demonstrate <10% difference between single leg hop bilaterally.  Baseline: Not able to perform due to restrictions  Goal status: ONGOING   PLAN:  PT FREQUENCY: 1-2x/week  PT DURATION: 12 weeks PLANNED INTERVENTIONS: 97110-Therapeutic exercises, 97530- Therapeutic activity, 97112- Neuromuscular re-education, 97535- Self Care, 02859- Manual therapy, [phone]- Gait training, [phone]- Electrical stimulation (unattended), [phone]- Electrical stimulation (manual), Patient/Family education, Balance training, Stair training, Joint mobilization, Joint manipulation, Spinal manipulation, and Spinal mobilization  PLAN FOR NEXT SESSION: Reassess goals for progress note. Manual: HS stretch and quad stretch. Standing marches and progress calf raises. Side step down.Progress knee flexion ROM and strengthening exercies: sled push. Forward Lunges.    Toribio Servant PT, DPT  [facility] [address] [place], [place], [address] Phone: [phone]   Fax:  [phone]

## 2024-04-30 ENCOUNTER — Ambulatory Visit: Admitting: Physical Therapy

## 2024-04-30 ENCOUNTER — Encounter (INDEPENDENT_AMBULATORY_CARE_PROVIDER_SITE_OTHER): Payer: Self-pay

## 2024-04-30 ENCOUNTER — Encounter (HOSPITAL_BASED_OUTPATIENT_CLINIC_OR_DEPARTMENT_OTHER): Payer: Self-pay | Admitting: Orthopaedic Surgery

## 2024-04-30 ENCOUNTER — Telehealth (HOSPITAL_BASED_OUTPATIENT_CLINIC_OR_DEPARTMENT_OTHER): Payer: Self-pay | Admitting: Orthopaedic Surgery

## 2024-04-30 DIAGNOSIS — M25561 Pain in right knee: Secondary | ICD-10-CM | POA: Diagnosis not present

## 2024-04-30 DIAGNOSIS — M25661 Stiffness of right knee, not elsewhere classified: Secondary | ICD-10-CM

## 2024-04-30 DIAGNOSIS — R262 Difficulty in walking, not elsewhere classified: Secondary | ICD-10-CM

## 2024-04-30 NOTE — Therapy (Addendum)
 OUTPATIENT PHYSICAL THERAPY PROGRESS NOTE   Dates of Reporting: 03/06/24-04/30/24    Patient Name: Gregory Banks. MRN: [id number] DOB:1965-12-16, 58 y.o., male Today's Date: 04/30/2024  END OF SESSION:  PT End of Session - 04/30/24 1125     Visit Number 10    Number of Visits 24    Date for PT Re-Evaluation 05/29/24    Authorization Type Medicare primary; [employer] commercial 2ndary    Authorization Time Period 03/06/24-05/29/24    Authorization - Visit Number 10    Authorization - Number of Visits 24    Progress Note Due on Visit 10    PT Start Time 1120    PT Stop Time 1200    PT Time Calculation (min) 40 min    Activity Tolerance Patient tolerated treatment well;No increased pain    Behavior During Therapy WFL for tasks assessed/performed            Past Medical History:  Diagnosis Date   Arthritis    knees, rt shoulder   Hyperlipidemia    Hypertension    MI (myocardial infarction) [facility])    Past Surgical History:  Procedure Laterality Date   CARDIAC CATHETERIZATION     Angioplasty with stent placement x 2   COLONOSCOPY WITH PROPOFOL  N/A 06/01/2017   Procedure: COLONOSCOPY WITH PROPOFOL ;  Surgeon: Therisa Bi, MD;  Location: [facility] END[facility]COPY;  Service: Gastroenterology;  Laterality: N/A;   KNEE ARTHROSCOPY WITH MENISCAL REPAIR Right 02/26/2024   Procedure: ARTHROSCOPY, KNEE, WITH MENISCUS REPAIR;  Surgeon: Genelle Standing, MD;  Location: [facility];  Service: Orthopedics;  Laterality: Right;  RIGHT KNEE ARTHROSCOPY WITH LATERAL MENISCUS REPAIR   OTHER SURGICAL HISTORY     2 Cardiac stents   TUMOR REMOVAL     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   Patient Active Problem List   Diagnosis Date Noted   Complex tear of lateral meniscus of right knee as current injury 02/26/2024   Hyperlipidemia    MI (myocardial infarction) (HCC)    Intracranial mass 01/27/2015   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   Anxiety 03/04/2014   Chronic coronary artery disease 03/04/2014   Depression  03/04/2014   Hypertension 03/04/2014   Shortness of breath 03/04/2014   PCP: Epifanio Alm SQUIBB, MD  REFERRING PROVIDER: [name] Genelle, MD (orthopedics)  REFERRING DIAG: s/p Rt latera meniscus repair  THERAPY DIAG:  Acute pain of right knee  Stiffness of right knee, not elsewhere classified  Difficulty in walking, not elsewhere classified  Rationale for Evaluation and Treatment: Rehabilitation  ONSET DATE: 02/26/24  SUBJECTIVE:   SUBJECTIVE STATEMENT:           Pt states that he continues to feel right hip pain that radiates from inner groin down posterior thigh to right foot especially when sitting in car. He has had sciatica in the past but he felt this pain more on the lateral side of his right hip rather down the posterior portion of right hip.  PERTINENT HISTORY: Knee pain and locking following a day of playing 4 basketball games, meniscus injury seen. Pt underwent lateral meniscus repair with Dr. Genelle on 6/3, placed in locked in extension, WBAT. At evaluation current pain: 6/10; Best pain: 0/10 Worst Pain: 6/10, on average has been around 4 PAIN:  Are you having pain? 6/10 today, he tweaked his knee getting off the couch today.  PRECAUTIONS: WBAT, locked brace at all times when walking.  WEIGHT BEARING RESTRICTIONS: WBAT  FALLS:  Has patient fallen in last 6 months?  No  LIVING ENVIRONMENT: Lives with: Wife Lives in: house  Stairs: 17 stairs to 2nd floor bedroom, no entry stairs  Has following equipment at home: crutches up to 6'6.   OCCUPATION: Retired   PLOF: Coaches basketball   PATIENT GOALS: Full recovery   NEXT MD VISIT: June 2025   OBJECTIVE:  Note: Objective measures were completed at Evaluation unless otherwise noted.  PATIENT SURVEYS:  LEFS: 20% 04/17/24: 78% (63/80) EDEMA:  Unable to see, still ace wrapped  LOWER EXTREMITY ROM:  A/PROM Right eval Left eval Right   04/23/24  Right  04/28/24 Right  04/30/24  Hip flexion       Hip extension        Hip abduction       Hip adduction       Hip internal rotation       Hip external rotation       Knee flexion 65 degrees  115/NT  117/NT  118/NT  Knee extension  5 degrees      Knee internal rotation       Knee external rotation       Knee abduction       Knee adduction       Ankle dorsiflexion       Ankle plantarflexion       Ankle inversion       Ankle eversion        (Blank rows = not tested)         TODAY'S TREATMENT   04/30/24: THEREX   Seated Matrix Bike 23 with half rotations for 2.5 min forward and 2.5 min backward    : 1650 ft    Slump on RLE: Negative   SLR on RLE: Negative    Modified Thomas Stretch on RLE 1 x 60 sec Modified Thomas Stretch on RLE with #10 AW 1 x 60 sec   Knee Flexion AROM 118  Seated Sciatic Nerve Glide on RLE 1 x 10  -min VC for sequence of head and leg movements     SELF CARE HOME MANAGEMENT    Adjustment of car seat height vertically and forward and backward position to avoid knees being lower than hips which causes increased strain on lateral tendons of hip.   Explanation of lower extremity dermatomes and how L5 pattern mimics the symptom he describes with numbness and tingling radiating down to the sole of his foot .     PATIENT EDUCATION:  Education details: Tissue healing timeline and explanation of car seat position and LE dermatomes  Person educated: Patient and spouse  Education method: collaborative learning, deliberate practice, positive reinforcement, explicit instruction, establish rules. Education comprehension: excellent  HOME EXERCISE PROGRAM: Access Code: [facility] URL: [email or web address] Date: 04/30/2024 Prepared by: Toribio Servant  Program Notes Lunge heel raise with left foot on step  3 x 10 3-4 DAYS PER WEEK; keep knee straight   Exercises - Supine Heel Slide with Strap  - 1 x daily - 7 x weekly - 3 sets - 15 reps - Seated Sciatic Nerve Glide With Cervical Motion  - 1 x daily - 7 x  weekly - 1 sets - 10 reps - Seated Knee Flexion at Wall  - 1 x daily - 7 x weekly - 1 sets - 10 reps - 30 sec  hold - Supine Sciatic Nerve Glide  - 1 x daily - 7 x weekly - 1 sets - 10 reps - Knee Flexion Extension Walk at Wall  -  1 x daily - 7 x weekly - 2 sets - 10 reps - 3 sec hold - Standing Knee Flexion Stretch on Step  - 1 x daily - 7 x weekly - 2 sets - 10 reps - 2-3 sec  hold - Modified Thomas Stretch  - 1 x daily - 7 x weekly - 3 reps - 30- 60 sec hold - Prone Quadriceps Stretch with Strap  - 1 x daily - 7 x weekly - 3 reps - 60 sec  hold - Seated Hip External Rotation Stretch  - 1 x daily - 7 x weekly - 3 reps - 60 sec  hold - Standing Hip Abduction with Counter Support  - 3-4 x weekly - 3 sets - 10 reps - Squat with TRX  - 3-4 x weekly - 3 sets - 10 reps - Step Up  - 3-4 x weekly - 3 sets - 10 reps  ASSESSMENT:  CLINICAL IMPRESSION: Pt is now s/p 9.5 weeks lateral meniscus repair of right knee. He shows improved knee flexion AROM and aerobic endurance. He was not limited by right hip pain during session despite reporting this pain initially and writer unable to reproduce his symptoms during session. He likely does have sciatic in right hip along with hip flexor tightness with him experiencing relief with hip flexor stretch. He will continue to benefit from skilled PT to improve right knee ROM and strength and to return to athletic activity without pain and discomfort to maintain his quality of life and physical fitness.      OBJECTIVE IMPAIRMENTS:  Decreased knowledge of condition, decreased use of DME, decreased mobility, difficulty walking, decreased strength, decreased ROM. ACTIVITY LIMITATIONS: Lifting, standing, walking, squatting, transfers, locomotion level PARTICIPATION LIMITATIONS: Cleaning, laundry, interpersonal relationships, driving, yardwork, community activity.  PERSONAL FACTORS: Age, behavior pattern, education, past/current experiences, transportation,  profession  are also affecting patient's functional outcome.  REHAB POTENTIAL: Great  CLINICAL DECISION MAKING: Great EVALUATION COMPLEXITY: Low  GOALS: Goals reviewed with patient? yes  SHORT TERM GOALS: Target date: 04/05/24 Patient will report comprehension, confidence, and consistent compliance and of a simple home exercise program established to facilitate symptoms management and basic strengthening and/or segment mobility.  Baseline: issued at eval 03/12/24: Able to perform HEP independently  Goal status: ACHIEVED    2.  Patient to demonstrate improved score on self-report measure by >9% to indicate reduced self-reported disability and/or pain.    Baseline: LEFS: 20%   04/17/24: 78% Goal status: ACHIEVED  3.  Pt to demonstrate Rt knee RM 0-90 degrees  Baseline: eval: 5-65 deg 03/20/24: 0-83  04/02/24: 0-90 deg on RLE  Goal status: ACHIEVED   LONG TERM GOALS: Target date: 05/06/24  Pt to demonstrate ability to perform >1558ft without pain limitation, less than 2/10 increase in NPRS to improve ability to participate in IADL and social events.  Baseline: 1,200 ft  Goal status: ONGOING  2.  Pt to demonstrate less than 10% strength deficit in between bilat knee extension and bilat ankle PF to prepare for return to leisure and community activity.  Baseline: Not able to load knee at this point in protocol   Goal status: ONGOING    3.  Patient to demonstrate improved performance on initial transfers assessment AEB improved reps per time or time per perform desired reps and/or reduced seat height and/or use of hands in order to improve safety, tolerance, and independence in ADL performance.   Baseline: 30 sec chair rise deferred  Goal status:  ONGOING   4.  Pt to demonstrate <10% difference between single leg hop bilaterally.  Baseline: Not able to perform due to restrictions  Goal status: ONGOING   PLAN:  PT FREQUENCY: 1-2x/week  PT DURATION: 12 weeks PLANNED INTERVENTIONS:  97110-Therapeutic exercises, 97530- Therapeutic activity, 97112- Neuromuscular re-education, [phone]- Self Care, 02859- Manual therapy, [phone]- Gait training, [phone]- Electrical stimulation (unattended), [phone]- Electrical stimulation (manual), Patient/Family education, Balance training, Stair training, Joint mobilization, Joint manipulation, Spinal manipulation, and Spinal mobilization  PLAN FOR NEXT SESSION: Reassess remainder of long term goals with exception of hop. Progress calf strength and knee strength with exercise progressions. Manual: HS stretch and quad stretch. Standing marches and progress calf raises. Side step down.Progress knee flexion ROM and strengthening exercies: sled push. Forward Lunges.    Toribio Servant PT, DPT  [facility] [address], [place], [address] Phone: [phone]   Fax:  [phone]

## 2024-04-30 NOTE — Telephone Encounter (Signed)
 Patient states that he knee pain has moved from his knee to his hip. Please advise.

## 2024-05-01 ENCOUNTER — Ambulatory Visit (HOSPITAL_BASED_OUTPATIENT_CLINIC_OR_DEPARTMENT_OTHER): Admitting: Orthopaedic Surgery

## 2024-05-01 ENCOUNTER — Ambulatory Visit (INDEPENDENT_AMBULATORY_CARE_PROVIDER_SITE_OTHER)

## 2024-05-01 ENCOUNTER — Other Ambulatory Visit (HOSPITAL_BASED_OUTPATIENT_CLINIC_OR_DEPARTMENT_OTHER): Payer: Self-pay

## 2024-05-01 DIAGNOSIS — M25551 Pain in right hip: Secondary | ICD-10-CM

## 2024-05-01 MED ORDER — LIDOCAINE HCL 1 % IJ SOLN
4.0000 mL | INTRAMUSCULAR | Status: AC | PRN
  Administered 2024-05-01: 4 mL

## 2024-05-01 MED ORDER — TRIAMCINOLONE ACETONIDE 40 MG/ML IJ SUSP
80.0000 mg | INTRAMUSCULAR | Status: AC | PRN
  Administered 2024-05-01: 80 mg via INTRA_ARTICULAR

## 2024-05-01 NOTE — Progress Notes (Signed)
 Follow-up evaluation     Right hip  Interval History:   Gregory Banks presents today for his right hip.  This has been painful particularly in the car and he is feeling this with the hip is in a more of a flexed position.  He has had pain now for several years.  He has not had any previous treatment.   PMH/PSH/Family History/Social History/Meds/Allergies:    Past Medical History:  Diagnosis Date  . Arthritis    knees, rt shoulder  . Hyperlipidemia   . Hypertension   . MI (myocardial infarction) [facility])    Past Surgical History:  Procedure Laterality Date  . CARDIAC CATHETERIZATION     Angioplasty with stent placement x 2  . COLONOSCOPY WITH PROPOFOL  N/A 06/01/2017   Procedure: COLONOSCOPY WITH PROPOFOL ;  Surgeon: Therisa Bi, MD;  Location: [facility] ENDOSCOPY;  Service: Gastroenterology;  Laterality: N/A;  . KNEE ARTHROSCOPY WITH MENISCAL REPAIR Right 02/26/2024   Procedure: ARTHROSCOPY, KNEE, WITH MENISCUS REPAIR;  Surgeon: Genelle Standing, MD;  Location: [facility];  Service: Orthopedics;  Laterality: Right;  RIGHT KNEE ARTHROSCOPY WITH LATERAL MENISCUS REPAIR  . OTHER SURGICAL HISTORY     2 Cardiac stents  . 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 Marital status: Married    Spouse name: Not on file  . Number of children: Not on file  . Years of education: Not on file  . Highest education level: Not on file  Occupational History  . Not on file  Tobacco Use  . Smoking status: Never  . Smokeless tobacco: Never  Substance and Sexual Activity  . Alcohol use: Yes    Comment: social  . Drug use: No  . Sexual activity: Yes  Other Topics Concern  . Not on file  Social History Narrative  . Not on file   Social Drivers of Health   Financial Resource Strain: Low Risk  (10/26/2023)   Received from [facility]   Overall Financial Resource Strain (CARDIA)   . Difficulty of  Paying Living Expenses: Not hard at all  Food Insecurity: No Food Insecurity (10/26/2023)   Received from [facility]   Hunger Vital Sign   . Within the past 12 months, you worried that your food would run out before you got the money to buy more.: Never true   . Within the past 12 months, the food you bought just didn't last and you didn't have money to get more.: Never true  Transportation Needs: No Transportation Needs (10/26/2023)   Received from [facility]   [facility] - Transportation   . In the past 12 months, has lack of transportation kept you from medical appointments or from getting medications?: No   . Lack of Transportation (Non-Medical): No  Physical Activity: Not on file  Stress: Not on file  Social Connections: Not on file   No family history on file. Allergies  Allergen Reactions  . Oxycodone -Acetaminophen  Nausea And Vomiting   Current Outpatient Medications  Medication Sig Dispense Refill  . aspirin  EC 325 MG tablet Take 1 tablet (325 mg total) by mouth daily. 30 tablet 0  . lisinopril (PRINIVIL,ZESTRIL) 20 MG tablet Take 10 mg by mouth daily.    SABRA  Metoprolol Succinate 25 MG CS24 Take 25 mg by mouth daily.    . Multiple Vitamin (MULTIVITAMIN WITH MINERALS) TABS tablet Take 1 tablet by mouth daily.    . nitroGLYCERIN (NITROSTAT) 0.4 MG SL tablet Place under the tongue.    . oxycodone  (OXY-IR) 5 MG capsule Take 1 capsule (5 mg total) by mouth every 4 (four) hours as needed (severe pain). 10 capsule 0  . oxyCODONE  (ROXICODONE ) 5 MG immediate release tablet Take 1 tablet (5 mg total) by mouth every 4 (four) hours as needed for severe pain (pain score 7-10) or breakthrough pain. 10 tablet 0  . pravastatin (PRAVACHOL) 20 MG tablet Take 20 mg by mouth at bedtime.    . ticagrelor (BRILINTA) 90 MG TABS tablet Take by mouth 2 (two) times daily.     No current facility-administered medications for this visit.   No results found.  Review of  Systems:   A ROS was performed including pertinent positives and negatives as documented in the HPI.   Musculoskeletal Exam:    There were no vitals taken for this visit.  Right hip with positive FADIR maneuver and 20 degrees internal rotation.  30 degrees external rotation also with pain.  Imaging:      I personally reviewed and interpreted the radiographs.   Assessment:   58 year old male with right hip pain in the setting of mild to moderate osteoarthritis.  At today's visit I did discuss treatment options.  Will plan to begin with a right hip ultrasound-guided injection  Plan :    - Right hip ultrasound-guided injection provided after verbal consent obtained    Procedure Note  Patient: Gregory Banks.             Date of Birth: 22-Dec-1965           MRN: [id number]             Visit Date: 05/01/2024  Procedures: Visit Diagnoses:  1. Pain in right hip     Large Joint Inj: R hip joint on 05/01/2024 10:18 AM Indications: pain Details: 22 G 3.5 in needle, ultrasound-guided anterolateral approach  Arthrogram: No  Medications: 4 mL lidocaine  1 %; 80 mg triamcinolone  acetonide 40 MG/ML Outcome: tolerated well, no immediate complications Procedure, treatment alternatives, risks and benefits explained, specific risks discussed. Consent was given by the patient. Immediately prior to procedure a time out was called to verify the correct patient, procedure, equipment, support staff and site/side marked as required. Patient was prepped and draped in the usual sterile fashion.          I personally saw and evaluated the patient, and participated in the management and treatment plan.  Elspeth Parker, MD Attending Physician, Orthopedic Surgery  This document was dictated using Dragon voice recognition software. A reasonable attempt at proof reading has been made to minimize errors.

## 2024-05-06 ENCOUNTER — Ambulatory Visit: Admitting: Physical Therapy

## 2024-05-06 DIAGNOSIS — M25661 Stiffness of right knee, not elsewhere classified: Secondary | ICD-10-CM

## 2024-05-06 DIAGNOSIS — M25561 Pain in right knee: Secondary | ICD-10-CM

## 2024-05-06 DIAGNOSIS — R262 Difficulty in walking, not elsewhere classified: Secondary | ICD-10-CM

## 2024-05-06 NOTE — Therapy (Addendum)
 OUTPATIENT PHYSICAL THERAPY TREATMENT NOTE    Patient Name: Gregory Banks. MRN: [id number] DOB:[birth year], 58 y.o., male Today's Date: 05/06/2024  END OF SESSION:  PT End of Session - 05/06/24 0957     Visit Number 11    Number of Visits 24    Date for PT Re-Evaluation 05/29/24    Authorization Type Medicare primary; eneric commercial 2ndary    Authorization Time Period 03/06/24-05/29/24    Authorization - Visit Number 11    Authorization - Number of Visits 24    Progress Note Due on Visit 20    PT Start Time 0945    PT Stop Time 1030    PT Time Calculation (min) 45 min    Activity Tolerance Patient tolerated treatment well;No increased pain    Behavior During Therapy WFL for tasks assessed/performed            Past Medical History:  Diagnosis Date   Arthritis    knees, rt shoulder   Hyperlipidemia    Hypertension    MI (myocardial infarction) [facility])    Past Surgical History:  Procedure Laterality Date   CARDIAC CATHETERIZATION     Angioplasty with stent placement x 2   COLONOSCOPY WITH PROPOFOL  N/A 06/01/2017   Procedure: COLONOSCOPY WITH PROPOFOL ;  Surgeon: Therisa Bi, MD;  Location: [facility] END[facility]COPY;  Service: Gastroenterology;  Laterality: N/A;   KNEE ARTHROSCOPY WITH MENISCAL REPAIR Right 02/26/2024   Procedure: ARTHROSCOPY, KNEE, WITH MENISCUS REPAIR;  Surgeon: Genelle Standing, MD;  Location: [facility];  Service: Orthopedics;  Laterality: Right;  RIGHT KNEE ARTHROSCOPY WITH LATERAL MENISCUS REPAIR   OTHER SURGICAL HISTORY     2 Cardiac stents   TUMOR REMOVAL     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   Patient Active Problem List   Diagnosis Date Noted   Complex tear of lateral meniscus of right knee as current injury 02/26/2024   Hyperlipidemia    MI (myocardial infarction) (HCC)    Intracranial mass 01/27/2015   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 Genelle. He said that Dr. B said everything is going well. He does continue to feel in right hip and received a shot in his hip and pain has been down.   PERTINENT HISTORY: Knee pain and locking following a day of playing 4 basketball games, meniscus injury seen. Pt underwent lateral meniscus repair with Dr. Genelle on 6/3, placed in locked in extension, WBAT. At evaluation current pain: 6/10; Best pain: 0/10 Worst Pain: 6/10, on average has been around 4 PAIN:  Are you having pain? 6/10 today, he tweaked his knee getting off the couch today.  PRECAUTIONS: WBAT, locked brace at all times when walking.  WEIGHT BEARING RESTRICTIONS: WBAT  FALLS:  Has patient fallen in last 6 months? No  LIVING ENVIRONMENT: Lives with: Wife Lives in: house  Stairs: 17 stairs to 2nd floor bedroom, no entry stairs  Has following equipment at home: crutches up  to 6'6.   OCCUPATION: Retired   PLOF: Coaches basketball   PATIENT GOALS: Full recovery   NEXT MD VISIT: June 2025   OBJECTIVE:  Note: Objective measures were completed at Evaluation unless otherwise noted.  PATIENT SURVEYS:  LEFS: 20% 04/17/24: 78% (63/80) EDEMA:  Unable to see, still ace wrapped  LOWER EXTREMITY ROM:  A/PROM Right eval Left eval Right   04/23/24  Right  04/28/24 Right  04/30/24 Right  05/06/24  Hip flexion        Hip extension        Hip abduction        Hip adduction        Hip internal rotation        Hip external rotation        Knee flexion 65  degrees  115/NT  117/NT  118/NT 123/NT  Knee extension  5 degrees     0/NT   Knee internal rotation        Knee external rotation        Knee abduction        Knee adduction        Ankle dorsiflexion        Ankle plantarflexion        Ankle inversion        Ankle eversion         (Blank rows = not tested)         TODAY'S TREATMENT   05/06/24: THEREX   Seated Matrix Bike 23 with half rotations for 2.5 min forward and 2.5 min backward    -Pt able to perform full rotation for first time. Knee ROM (See Above)   Forward Lunges with 1 UE support 2 x 10   -min VC to increase base of support  1 RM testing:  Leg Press  R/L 80/160  Knee Ext R/L 55/115    Kick Stand RDL on RLE with #10 KB 2 x 10 with tap onto 6 inch platform    -min VC to increase base of support and to maintain thoracic extension    PATIENT EDUCATION:  Education details: Tissue healing timeline and explanation of car seat position and LE dermatomes  Person educated: Patient and spouse  Education method: collaborative learning, deliberate practice, positive reinforcement, explicit instruction, establish rules. Education comprehension: excellent  HOME EXERCISE PROGRAM: Access Code: [facility] URL: [email or web address] Date: 05/06/2024 Prepared by: Toribio Servant  Program Notes Lunge heel raise with left foot on step  3 x 10 3-4 DAYS PER WEEK; keep knee straight   Exercises - Supine Heel Slide with Strap  - 1 x daily - 7 x weekly - 3 sets - 15 reps - Seated Sciatic Nerve Glide With Cervical Motion  - 1 x daily - 7 x weekly - 1 sets - 10 reps - Seated Knee Flexion at Wall  - 1 x daily - 7 x weekly - 1 sets - 10 reps - 30 sec  hold - Supine Sciatic Nerve Glide  - 1 x daily - 7 x weekly - 1 sets - 10 reps - Knee Flexion Extension Walk at Wall  - 1 x daily - 7 x weekly - 2 sets - 10 reps - 3 sec hold - Standing Knee Flexion Stretch on Step  - 1 x daily - 7 x weekly - 2 sets - 10 reps - 2-3 sec   hold - Modified Thomas Stretch  - 1 x daily - 7 x weekly - 3 reps -  30- 60 sec hold - Prone Quadriceps Stretch with Strap  - 1 x daily - 7 x weekly - 3 reps - 60 sec  hold - Seated Hip External Rotation Stretch  - 1 x daily - 7 x weekly - 3 reps - 60 sec  hold - Standing Hip Abduction with Counter Support  - 3-4 x weekly - 3 sets - 10 reps - Mini Lunge  - 3-4 x weekly - 3 sets - 10 reps - Single Leg Deadlift with Kettlebell  - 3-4 x weekly - 3 sets - 10 reps   ASSESSMENT:  CLINICAL IMPRESSION: Pt is now s/p 10 weeks lateral meniscus repair of right knee. Progressing towards goals with improvement in right knee ROM and strength. He was able to perform full rotation on bicycle for the first time and he is now able to perform single leg strengthening exercises without increasing his pain or without excessive compensation. He will continue to benefit from skilled PT to improve right knee ROM and strength and to return to athletic activity without pain and discomfort to maintain his quality of life and physical fitness.     OBJECTIVE IMPAIRMENTS:  Decreased knowledge of condition, decreased use of DME, decreased mobility, difficulty walking, decreased strength, decreased ROM. ACTIVITY LIMITATIONS: Lifting, standing, walking, squatting, transfers, locomotion level PARTICIPATION LIMITATIONS: Cleaning, laundry, interpersonal relationships, driving, yardwork, community activity.  PERSONAL FACTORS: Age, behavior pattern, education, past/current experiences, transportation, profession  are also affecting patient's functional outcome.  REHAB POTENTIAL: Great  CLINICAL DECISION MAKING: Great EVALUATION COMPLEXITY: Low  GOALS: Goals reviewed with patient? yes  SHORT TERM GOALS: Target date: 04/05/24 Patient will report comprehension, confidence, and consistent compliance and of a simple home exercise program established to facilitate symptoms management and basic strengthening and/or segment mobility.   Baseline: issued at eval 03/12/24: Able to perform HEP independently  Goal status: ACHIEVED    2.  Patient to demonstrate improved score on self-report measure by >9% to indicate reduced self-reported disability and/or pain.    Baseline: LEFS: 20%   04/17/24: 78% Goal status: ACHIEVED  3.  Pt to demonstrate Rt knee RM 0-90 degrees  Baseline: eval: 5-65 deg 03/20/24: 0-83  04/02/24: 0-90 deg on RLE  Goal status: ACHIEVED   LONG TERM GOALS: Target date: 05/06/24  Pt to demonstrate ability to perform >1559ft without pain limitation, less than 2/10 increase in NPRS to improve ability to participate in IADL and social events.  Baseline: 1,200 ft 04/30/24: 1650 ft   Goal status: ACHIEVED     2.  Pt to demonstrate less than 10% strength deficit in between bilat knee extension and bilat ankle PF to prepare for return to leisure and community activity.  Baseline: Leg Press 1 RM R/L 80/160  Knee Ext 1 RM R/L  55/115  Goal status: ONGOING    3.  Patient to demonstrate improved performance on initial transfers assessment AEB improved reps per time or time per perform desired reps and/or reduced seat height and/or use of hands in order to improve safety, tolerance, and independence in ADL performance.   Baseline: Goal not appropriate for measuring LE endurance for pt's age Goal status: Deferred   4.  Pt to demonstrate <10% difference between single leg hop bilaterally.  Baseline: Not able to perform due to restrictions  Goal status: ONGOING   PLAN:  PT FREQUENCY: 1-2x/week  PT DURATION: 12 weeks PLANNED INTERVENTIONS: 97110-Therapeutic exercises, 97530- Therapeutic activity, [id number]- Neuromuscular re-education, 97535- Self Care, 02859- Manual therapy,  02883- Gait training, [phone]- Electrical stimulation (unattended), [phone]- Electrical stimulation (manual), Patient/Family education, Balance training, Stair training, Joint mobilization, Joint manipulation, Spinal manipulation, and Spinal  mobilization  PLAN FOR NEXT SESSION:  Test Calf Strength 1 RM. Squat for knee mobility and maybe lunge with trx. Progress calf strength and knee strength with exercise progressions. Manual: HS stretch and quad stretch. Standing marches and progress calf raises. Side step down.Progress knee flexion ROM and strengthening exercies: sled push. Forward Lunges.    Toribio Servant PT, DPT  [facility] [address] [place], [address] Phone: [phone]   Fax:  [phone]

## 2024-05-07 ENCOUNTER — Ambulatory Visit: Admitting: Physical Therapy

## 2024-05-07 DIAGNOSIS — R262 Difficulty in walking, not elsewhere classified: Secondary | ICD-10-CM

## 2024-05-07 DIAGNOSIS — M25561 Pain in right knee: Secondary | ICD-10-CM | POA: Diagnosis not present

## 2024-05-07 DIAGNOSIS — M25661 Stiffness of right knee, not elsewhere classified: Secondary | ICD-10-CM

## 2024-05-07 NOTE — Therapy (Signed)
 OUTPATIENT PHYSICAL THERAPY TREATMENT NOTE    Patient Name: Gregory Banks. MRN: [id number] DOB:1966-02-08, 58 y.o., male Today's Date: 05/07/2024  END OF SESSION:  PT End of Session - 05/07/24 1034     Visit Number 12    Number of Visits 24    Date for PT Re-Evaluation 05/29/24    Authorization Type Medicare primary; [employer] 2ndary    Authorization Time Period 03/06/24-05/29/24    Authorization - Visit Number 12    Authorization - Number of Visits 24    Progress Note Due on Visit 20    PT Start Time 1030    PT Stop Time 1115    PT Time Calculation (min) 45 min    Activity Tolerance Patient tolerated treatment well;No increased pain    Behavior During Therapy WFL for tasks assessed/performed            Past Medical History:  Diagnosis Date   Arthritis    knees, rt shoulder   Hyperlipidemia    Hypertension    MI (myocardial infarction) [facility])    Past Surgical History:  Procedure Laterality Date   CARDIAC CATHETERIZATION     Angioplasty with stent placement x 2   COLONOSCOPY WITH PROPOFOL  N/A 06/01/2017   Procedure: COLONOSCOPY WITH PROPOFOL ;  Surgeon: Therisa Bi, MD;  Location: [facility] ENDOSCOPY;  Service: Gastroenterology;  Laterality: N/A;   KNEE ARTHROSCOPY WITH MENISCAL REPAIR Right 02/26/2024   Procedure: ARTHROSCOPY, KNEE, WITH MENISCUS REPAIR;  Surgeon: Genelle Standing, MD;  Location: [facility];  Service: Orthopedics;  Laterality: Right;  RIGHT KNEE ARTHROSCOPY WITH LATERAL MENISCUS REPAIR   OTHER SURGICAL HISTORY     2 Cardiac stents   TUMOR REMOVAL     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   Patient Active Problem List   Diagnosis Date Noted   Complex tear of lateral meniscus of right knee as current injury 02/26/2024   Hyperlipidemia    MI (myocardial infarction) (HCC)    Intracranial mass 01/27/2015   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   Anxiety 03/04/2014   Chronic coronary artery disease 03/04/2014   Depression 03/04/2014   Hypertension 03/04/2014    Shortness of breath 03/04/2014   PCP: Epifanio Alm SQUIBB, MD  REFERRING PROVIDER: [name] Genelle, MD (orthopedics)  REFERRING DIAG: s/p Rt latera meniscus repair  THERAPY DIAG:  Acute pain of right knee  Stiffness of right knee, not elsewhere classified  Difficulty in walking, not elsewhere classified  Rationale for Evaluation and Treatment: Rehabilitation  ONSET DATE: 02/26/24  SUBJECTIVE:   SUBJECTIVE STATEMENT: He reports not soreness after last session from exercises. He does not have increased knee pain.   PERTINENT HISTORY: Knee pain and locking following a day of playing 4 basketball games, meniscus injury seen. Pt underwent lateral meniscus repair with Dr. Genelle on 6/3, placed in locked in extension, WBAT. At evaluation current pain: 6/10; Best pain: 0/10 Worst Pain: 6/10, on average has been around 4 PAIN:  Are you having pain? 6/10 today, he tweaked his knee getting off the couch today.  PRECAUTIONS: WBAT, locked brace at all times when walking.  WEIGHT BEARING RESTRICTIONS: WBAT  FALLS:  Has patient fallen in last 6 months? No  LIVING ENVIRONMENT: Lives with: Wife Lives in: house  Stairs: 17 stairs to 2nd floor bedroom, no entry stairs  Has following equipment at home: crutches up to 6'6.   OCCUPATION: Retired   PLOF: Coaches basketball   PATIENT GOALS: Full recovery   NEXT MD VISIT: June 2025  OBJECTIVE:  Note: Objective measures were completed at Evaluation unless otherwise noted.  PATIENT SURVEYS:  LEFS: 20% 04/17/24: 78% (63/80) EDEMA:  Unable to see, still ace wrapped  LOWER EXTREMITY ROM:  A/PROM Right eval Left eval Right   04/23/24  Right  04/28/24 Right  04/30/24 Right  05/06/24  Hip flexion        Hip extension        Hip abduction        Hip adduction        Hip internal rotation        Hip external rotation        Knee flexion 65 degrees  115/NT  117/NT  118/NT 123/NT  Knee extension  5 degrees     0/NT   Knee internal rotation         Knee external rotation        Knee abduction        Knee adduction        Ankle dorsiflexion        Ankle plantarflexion        Ankle inversion        Ankle eversion         (Blank rows = not tested)         TODAY'S TREATMENT   05/07/24: THEREX  TM warm up with 5 min at 3.0 mph  1 RM with Heel Raise with R/L partial on RLE/ full on LLE   Modified Single LE on RLE with LLE on 4 inch step 1 x 10   Modified Single LE on RLE with LLE on 4 inch step 1 x 10   Step Down on 6 inch on RLE 6 inch platform with 1 UE support 1 x 10   -Pt unable to control eccentric step down   Step Down on 4 inch on RLE 4 inch platform with 1 UE support 3 x 10   Lateral Step Up on 4 inch on RLE 4 inch platform with 2 UE support 1 x 10   Lateral Step Up on 6 inch on RLE 4 inch platform with 2 UE support 2 x 10   -min VC to decrease increased lumber flexion due to increased BUE support      PATIENT EDUCATION:  Education details: Tissue healing timeline and explanation of car seat position and LE dermatomes  Person educated: Patient and spouse  Education method: Research scientist (medical), deliberate practice, positive reinforcement, explicit instruction, establish rules. Education comprehension: excellent  HOME EXERCISE PROGRAM: Access Code: [facility] URL: [email or web address] Date: 05/07/2024 Prepared by: Toribio Servant  Program Notes Heel raise on right leg with left foot on step  3 x 10  3-4 days per week   Exercises - Supine Heel Slide with Strap  - 1 x daily - 7 x weekly - 3 sets - 15 reps - Seated Sciatic Nerve Glide With Cervical Motion  - 1 x daily - 7 x weekly - 1 sets - 10 reps - Seated Knee Flexion at Wall  - 1 x daily - 7 x weekly - 1 sets - 10 reps - 30 sec  hold - Supine Sciatic Nerve Glide  - 1 x daily - 7 x weekly - 1 sets - 10 reps - Knee Flexion Extension Walk at Wall  - 1 x daily - 7 x weekly - 2 sets - 10 reps - 3 sec hold - Standing Knee Flexion Stretch on  Step  - 1 x daily -  7 x weekly - 2 sets - 10 reps - 2-3 sec  hold - Modified Thomas Stretch  - 1 x daily - 7 x weekly - 3 reps - 30- 60 sec hold - Prone Quadriceps Stretch with Strap  - 1 x daily - 7 x weekly - 3 reps - 60 sec  hold - Seated Hip External Rotation Stretch  - 1 x daily - 7 x weekly - 3 reps - 60 sec  hold - Single Leg Deadlift with Kettlebell  - 3-4 x weekly - 3 sets - 10 reps - Forward Step Down Touch with Heel  - 3-4 x weekly - 3 sets - 10 reps - Lateral Step Down  - 3-4 x weekly - 3 sets - 10 reps   ASSESSMENT:  CLINICAL IMPRESSION: Pt is now s/p 10.5 weeks lateral meniscus repair of right knee. Continuation of plan of care with pt exhibiting ongoing improvement with right knee strength with ability to perform single leg eccentric quad strengthening exercises without an increase in his pain. He will continue to benefit from skilled PT to improve right knee ROM and strength and to return to athletic activity without pain and discomfort to maintain his quality of life and physical fitness.     OBJECTIVE IMPAIRMENTS:  Decreased knowledge of condition, decreased use of DME, decreased mobility, difficulty walking, decreased strength, decreased ROM. ACTIVITY LIMITATIONS: Lifting, standing, walking, squatting, transfers, locomotion level PARTICIPATION LIMITATIONS: Cleaning, laundry, interpersonal relationships, driving, yardwork, community activity.  PERSONAL FACTORS: Age, behavior pattern, education, past/current experiences, transportation, profession  are also affecting patient's functional outcome.  REHAB POTENTIAL: Great  CLINICAL DECISION MAKING: Great EVALUATION COMPLEXITY: Low  GOALS: Goals reviewed with patient? yes  SHORT TERM GOALS: Target date: 04/05/24 Patient will report comprehension, confidence, and consistent compliance and of a simple home exercise program established to facilitate symptoms management and basic strengthening and/or segment mobility.   Baseline: issued at eval 03/12/24: Able to perform HEP independently  Goal status: ACHIEVED    2.  Patient to demonstrate improved score on self-report measure by >9% to indicate reduced self-reported disability and/or pain.    Baseline: LEFS: 20%   04/17/24: 78% Goal status: ACHIEVED  3.  Pt to demonstrate Rt knee RM 0-90 degrees  Baseline: eval: 5-65 deg 03/20/24: 0-83  04/02/24: 0-90 deg on RLE  Goal status: ACHIEVED   LONG TERM GOALS: Target date: 05/06/24  Pt to demonstrate ability to perform >1522ft without pain limitation, less than 2/10 increase in NPRS to improve ability to participate in IADL and social events.  Baseline: 1,200 ft 04/30/24: 1650 ft   Goal status: ACHIEVED     2.  Pt to demonstrate less than 10% strength deficit in between bilat knee extension and bilat ankle PF to prepare for return to leisure and community activity.  Baseline: Leg Press 1 RM R/L 80/160  Knee Ext 1 RM R/L  55/115  Single Leg Calf Raise R/L partial heel raise/ full heel raise on LLE Goal status: ONGOING    3.  Patient to demonstrate improved performance on initial transfers assessment AEB improved reps per time or time per perform desired reps and/or reduced seat height and/or use of hands in order to improve safety, tolerance, and independence in ADL performance.   Baseline: Goal not appropriate for measuring LE endurance for pt's age Goal status: Deferred   4.  Pt to demonstrate <10% difference between single leg hop bilaterally.  Baseline: Not able to perform due to restrictions  Goal status: ONGOING   PLAN:  PT FREQUENCY: 1-2x/week  PT DURATION: 12 weeks PLANNED INTERVENTIONS: 97110-Therapeutic exercises, 97530- Therapeutic activity, [id number]- Neuromuscular re-education, [phone]- Self Care, 02859- Manual therapy, [phone]- Gait training, [phone]- Electrical stimulation (unattended), [phone]- Electrical stimulation (manual), Patient/Family education, Balance training, Stair training, Joint  mobilization, Joint manipulation, Spinal manipulation, and Spinal mobilization  PLAN FOR NEXT SESSION: Warm Up on Nu-Step for increased knee flexion.  Test Calf Strength 1 RM. Squat for knee mobility and maybe lunge with trx. Progress calf strength and knee strength with exercise progressions. Manual: HS stretch and quad stretch. Standing marches and progress calf raises. Side step down.Progress knee flexion ROM and strengthening exercies: sled push. Forward Lunges.    Toribio Servant PT, DPT  [facility] [address], [place], [address] Phone: [phone]   Fax:  [phone]

## 2024-05-13 ENCOUNTER — Ambulatory Visit: Admitting: Physical Therapy

## 2024-05-13 DIAGNOSIS — M25661 Stiffness of right knee, not elsewhere classified: Secondary | ICD-10-CM

## 2024-05-13 DIAGNOSIS — M25561 Pain in right knee: Secondary | ICD-10-CM

## 2024-05-13 DIAGNOSIS — R262 Difficulty in walking, not elsewhere classified: Secondary | ICD-10-CM

## 2024-05-13 NOTE — Therapy (Signed)
 OUTPATIENT PHYSICAL THERAPY TREATMENT NOTE    Patient Name: Gregory Banks. 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    Authorization Time Period 03/06/24-05/29/24    Authorization - Visit Number 13    Authorization - Number of Visits 24    Progress Note Due on Visit 20    PT Start Time 1120    PT Stop Time 1200    PT Time Calculation (min) 40 min    Activity Tolerance Patient tolerated treatment well;No increased pain    Behavior During Therapy WFL for tasks assessed/performed            Past Medical History:  Diagnosis Date   Arthritis    knees, rt shoulder   Hyperlipidemia    Hypertension    MI (myocardial infarction) [facility])    Past Surgical History:  Procedure Laterality Date   CARDIAC CATHETERIZATION     Angioplasty with stent placement x 2   COLONOSCOPY WITH PROPOFOL  N/A 06/01/2017   Procedure: COLONOSCOPY WITH PROPOFOL ;  Surgeon: Therisa Bi, MD;  Location: [facility] END[facility]COPY;  Service: Gastroenterology;  Laterality: N/A;   KNEE ARTHROSCOPY WITH MENISCAL REPAIR Right 02/26/2024   Procedure: ARTHROSCOPY, KNEE, WITH MENISCUS REPAIR;  Surgeon: Genelle Standing, MD;  Location: [facility];  Service: Orthopedics;  Laterality: Right;  RIGHT KNEE ARTHROSCOPY WITH LATERAL MENISCUS REPAIR   OTHER SURGICAL HISTORY     2 Cardiac stents   TUMOR REMOVAL     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   Patient Active Problem List   Diagnosis Date Noted   Complex tear of lateral meniscus of right knee as current injury 02/26/2024   Hyperlipidemia    MI (myocardial infarction) (HCC)    Intracranial mass 01/27/2015   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   Anxiety 03/04/2014   Chronic coronary artery disease 03/04/2014   Depression 03/04/2014   Hypertension 03/04/2014    Shortness of breath 03/04/2014   PCP: Epifanio Alm SQUIBB, MD  REFERRING PROVIDER: [name] Genelle, MD (orthopedics)  REFERRING DIAG: s/p Rt latera meniscus repair  THERAPY DIAG:  Acute pain of right knee  Stiffness of right knee, not elsewhere classified  Difficulty in walking, not elsewhere classified  Rationale for Evaluation and Treatment: Rehabilitation  ONSET DATE: 02/26/24  SUBJECTIVE:   SUBJECTIVE STATEMENT: Pt states that he did feel muscle soreness in right hip pain due to muscle soreness.    PERTINENT HISTORY: Knee pain and locking following a day of playing 4 basketball games, meniscus injury seen. Pt underwent lateral meniscus repair with Dr. Genelle on 6/3, placed in locked in extension, WBAT. At evaluation current pain: 6/10; Best pain: 0/10 Worst Pain: 6/10, on average has been around 4 PAIN:  Are you having pain? 6/10 today, he tweaked his knee getting off the couch today.  PRECAUTIONS: WBAT, locked brace at all times when walking.  WEIGHT BEARING RESTRICTIONS: WBAT  FALLS:  Has patient fallen in last 6 months? No  LIVING ENVIRONMENT: Lives with: Wife Lives in: house  Stairs: 17 stairs to 2nd floor bedroom, no entry stairs  Has following equipment at home: crutches up to 6'6.   OCCUPATION: Retired   PLOF: Coaches basketball   PATIENT GOALS: Full recovery   NEXT MD VISIT: June 2025  OBJECTIVE:  Note: Objective measures were completed at Evaluation unless otherwise noted.  PATIENT SURVEYS:  LEFS: 20% 04/17/24: 78% (63/80) EDEMA:  Unable to see, still ace wrapped  LOWER EXTREMITY ROM:  A/PROM Right eval Left eval Right   04/23/24  Right  04/28/24 Right  04/30/24 Right  05/06/24 Right  05/13/24   Hip flexion         Hip extension         Hip abduction         Hip adduction         Hip internal rotation         Hip external rotation         Knee flexion 65 degrees 125 deg  115/NT  117/NT  118/NT 123/NT 125 deg   Knee extension  5 degrees 0 deg     0/NT  0 deg   Knee internal rotation         Knee external rotation         Knee abduction         Knee adduction         Ankle dorsiflexion         Ankle plantarflexion         Ankle inversion         Ankle eversion          (Blank rows = not tested)      MMT   Right 05/13/2022 Left 05/13/2022  Hip flexion 4 4+  Hip extension    Hip abduction 4 4+  Hip adduction    Hip internal rotation    Hip external rotation    Knee flexion 4+ 4  Knee extension 4+ 4  Ankle dorsiflexion    Ankle plantarflexion    Ankle inversion    Ankle eversion     (Blank rows = not tested)           TODAY'S TREATMENT   05/13/24: THEREX  Matrix recumbent bicycle for full rotations 5 min Knee Flexion AROM R/L 125/125   Knee MMT  -Ext R/L 4+/4+   -Flex R/L 4+/4+ Hip MMT   -Abd R/L 4/4+ -Flex R/L 4/4+    Left Side Lying Hip Abduction  3 x 10   Active Straight Leg on RLE with green band 2 x 10   - min VC  Modified Straight Leg on RLE with green band 1 x 10   Step Down on RLE off 4 inch step with 1 UE support 1 x 10  Step Down on RLE off 6 inch step with 1 UE support 3 x 10     PATIENT EDUCATION:  Education details: Tissue healing timeline and explanation of car seat position and LE dermatomes  Person educated: Patient and spouse  Education method: collaborative learning, deliberate practice, positive reinforcement, explicit instruction, establish rules. Education comprehension: excellent  HOME EXERCISE PROGRAM: Access Code: [facility] URL: [email or web address] Date: 05/13/2024 Prepared by: Toribio Servant  Program Notes Heel raise on right leg with left foot on step  3 x 10  3-4 days per week   Exercises - Seated Sciatic Nerve Glide With Cervical Motion  - 1 x daily - 7 x weekly - 1 sets - 10 reps - Seated Knee Flexion at Wall  - 1 x daily - 7 x weekly - 1 sets - 10 reps - 30 sec  hold - Supine Sciatic Nerve Glide  - 1 x daily - 7 x weekly - 1 sets -  10  reps - Knee Flexion Extension Walk at Wall  - 1 x daily - 7 x weekly - 2 sets - 10 reps - 3 sec hold - Modified Thomas Stretch  - 1 x daily - 7 x weekly - 3 reps - 30- 60 sec hold - Prone Quadriceps Stretch with Strap  - 1 x daily - 7 x weekly - 3 reps - 60 sec  hold - Seated Hip External Rotation Stretch  - 1 x daily - 7 x weekly - 3 reps - 60 sec  hold - Single Leg Deadlift with Kettlebell  - 3-4 x weekly - 3 sets - 10 reps - Forward Step Down Touch with Heel  - 3-4 x weekly - 2-3 sets - 10 reps - Lateral Step Down  - 3-4 x weekly - 3 sets - 10 reps - Sidelying Hip Abduction  - 3-4 x weekly - 3 sets - 10 reps - Supine Straight-Leg Raise With Resistance at Ankles  - 3-4 x weekly - 3 sets - 10 reps  ASSESSMENT:  CLINICAL IMPRESSION: Pt is now s/p 11 weeks lateral meniscus repair of right knee. Pt shows ongoing progress towards goals with improved right knee mobility and strength. He is close to initiating return to running protocol with only remaining limitations being right hip and knee strength. He will continue to benefit from skilled PT to improve right knee ROM and strength and to return to athletic activity without pain and discomfort to maintain his quality of life and physical fitness.      OBJECTIVE IMPAIRMENTS:  Decreased knowledge of condition, decreased use of DME, decreased mobility, difficulty walking, decreased strength, decreased ROM. ACTIVITY LIMITATIONS: Lifting, standing, walking, squatting, transfers, locomotion level PARTICIPATION LIMITATIONS: Cleaning, laundry, interpersonal relationships, driving, yardwork, community activity.  PERSONAL FACTORS: Age, behavior pattern, education, past/current experiences, transportation, profession  are also affecting patient's functional outcome.  REHAB POTENTIAL: Great  CLINICAL DECISION MAKING: Great EVALUATION COMPLEXITY: Low  GOALS: Goals reviewed with patient? yes  SHORT TERM GOALS: Target date: 04/05/24 Patient will report  comprehension, confidence, and consistent compliance and of a simple home exercise program established to facilitate symptoms management and basic strengthening and/or segment mobility.  Baseline: issued at eval 03/12/24: Able to perform HEP independently  Goal status: ACHIEVED    2.  Patient to demonstrate improved score on self-report measure by >9% to indicate reduced self-reported disability and/or pain.    Baseline: LEFS: 20%   04/17/24: 78% Goal status: ACHIEVED  3.  Pt to demonstrate Rt knee RM 0-90 degrees  Baseline: eval: 5-65 deg 03/20/24: 0-83  04/02/24: 0-90 deg on RLE  Goal status: ACHIEVED   LONG TERM GOALS: Target date: 05/29/24  Pt to demonstrate ability to perform >1530ft without pain limitation, less than 2/10 increase in NPRS to improve ability to participate in IADL and social events.  Baseline: 1,200 ft 04/30/24: 1650 ft   Goal status: ACHIEVED     2.  Pt to demonstrate less than 10% strength deficit in between bilat knee extension and bilat ankle PF to prepare for return to leisure and community activity.  Baseline: Leg Press 1 RM R/L 80/160  Knee Ext 1 RM R/L  55/115  Single Leg Calf Raise R/L partial heel raise/ full heel raise on LLE  Goal status: ONGOING    3.  Patient to demonstrate improved performance on initial transfers assessment AEB improved reps per time or time per perform desired reps and/or reduced seat height and/or use  of hands in order to improve safety, tolerance, and independence in ADL performance.   Baseline: Goal not appropriate for measuring LE endurance for pt's age Goal status: Deferred   4.  Pt to demonstrate <10% difference between single leg hop bilaterally.  Baseline: Not able to perform due to restrictions  Goal status: ONGOING   PLAN:  PT FREQUENCY: 1-2x/week  PT DURATION: 12 weeks PLANNED INTERVENTIONS: 97110-Therapeutic exercises, 97530- Therapeutic activity, 97112- Neuromuscular re-education, 97535- Self Care, 02859- Manual  therapy, [phone]- Gait training, [phone]- Electrical stimulation (unattended), [phone]- Electrical stimulation (manual), Patient/Family education, Balance training, Stair training, Joint mobilization, Joint manipulation, Spinal manipulation, and Spinal mobilization  PLAN FOR NEXT SESSION: Warm Up on Nu-Step for increased knee flexion. Heel raises for calf strengthening or OMEGA heel raises on leg press. Progress step down and side step up. Evaluate hip and knee MMT and update HEP. Attempt forward lunges and sled push.     Toribio Servant PT, DPT  [facility] [address] [place], [address] Phone: [phone]   Fax:  [phone]

## 2024-05-14 ENCOUNTER — Ambulatory Visit: Admitting: Physical Therapy

## 2024-05-14 ENCOUNTER — Ambulatory Visit (HOSPITAL_BASED_OUTPATIENT_CLINIC_OR_DEPARTMENT_OTHER): Admitting: Orthopaedic Surgery

## 2024-05-15 ENCOUNTER — Ambulatory Visit: Admitting: Physical Therapy

## 2024-05-15 DIAGNOSIS — M25561 Pain in right knee: Secondary | ICD-10-CM | POA: Diagnosis not present

## 2024-05-15 DIAGNOSIS — M25661 Stiffness of right knee, not elsewhere classified: Secondary | ICD-10-CM

## 2024-05-15 DIAGNOSIS — R262 Difficulty in walking, not elsewhere classified: Secondary | ICD-10-CM

## 2024-05-15 NOTE — Therapy (Signed)
 OUTPATIENT PHYSICAL THERAPY TREATMENT NOTE    Patient Name: Gregory Banks. MRN: [id number] DOB:30-Oct-1965, 58 y.o., male Today's Date: 05/15/2024  END OF SESSION:  PT End of Session - 05/15/24 0910     Visit Number 14    Number of Visits 24    Date for PT Re-Evaluation 05/29/24    Authorization Type Medicare primary; [employer] 2ndary    Authorization Time Period 03/06/24-05/29/24    Authorization - Visit Number 14    Authorization - Number of Visits 24    Progress Note Due on Visit 20    PT Start Time 0905    PT Stop Time 0945    PT Time Calculation (min) 40 min    Activity Tolerance Patient tolerated treatment well;No increased pain    Behavior During Therapy WFL for tasks assessed/performed             Past Medical History:  Diagnosis Date   Arthritis    knees, rt shoulder   Hyperlipidemia    Hypertension    MI (myocardial infarction) [facility])    Past Surgical History:  Procedure Laterality Date   CARDIAC CATHETERIZATION     Angioplasty with stent placement x 2   COLONOSCOPY WITH PROPOFOL  N/A 06/01/2017   Procedure: COLONOSCOPY WITH PROPOFOL ;  Surgeon: Therisa Bi, MD;  Location: [facility] END[facility]COPY;  Service: Gastroenterology;  Laterality: N/A;   KNEE ARTHROSCOPY WITH MENISCAL REPAIR Right 02/26/2024   Procedure: ARTHROSCOPY, KNEE, WITH MENISCUS REPAIR;  Surgeon: Genelle Standing, MD;  Location: [facility];  Service: Orthopedics;  Laterality: Right;  RIGHT KNEE ARTHROSCOPY WITH LATERAL MENISCUS REPAIR   OTHER SURGICAL HISTORY     2 Cardiac stents   TUMOR REMOVAL     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   Patient Active Problem List   Diagnosis Date Noted   Complex tear of lateral meniscus of right knee as current injury 02/26/2024   Hyperlipidemia    MI (myocardial infarction) (HCC)    Intracranial mass 01/27/2015   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   Anxiety 03/04/2014   Chronic coronary artery disease 03/04/2014   Depression 03/04/2014   Hypertension 03/04/2014    Shortness of breath 03/04/2014   PCP: Epifanio Alm SQUIBB, MD  REFERRING PROVIDER: [name] Genelle, MD (orthopedics)  REFERRING DIAG: s/p Rt latera meniscus repair  THERAPY DIAG:  Acute pain of right knee  Stiffness of right knee, not elsewhere classified  Difficulty in walking, not elsewhere classified  Rationale for Evaluation and Treatment: Rehabilitation  ONSET DATE: 02/26/24  SUBJECTIVE:   SUBJECTIVE STATEMENT: Pt reports ongoing muscle soreness but that he was able to perform all exercises.  He does describe feeling that his right knee was more difficult to bend compared to left knee even before the meniscal tear.   PERTINENT HISTORY: Knee pain and locking following a day of playing 4 basketball games, meniscus injury seen. Pt underwent lateral meniscus repair with Dr. Genelle on 6/3, placed in locked in extension, WBAT. At evaluation current pain: 6/10; Best pain: 0/10 Worst Pain: 6/10, on average has been around 4 PAIN:  Are you having pain? 6/10 today, he tweaked his knee getting off the couch today.  PRECAUTIONS: WBAT, locked brace at all times when walking.  WEIGHT BEARING RESTRICTIONS: WBAT  FALLS:  Has patient fallen in last 6 months? No  LIVING ENVIRONMENT: Lives with: Wife Lives in: house  Stairs: 17 stairs to 2nd floor bedroom, no entry stairs  Has following equipment at home: crutches up to 6'6.  OCCUPATION: Retired   PLOF: Coaches basketball   PATIENT GOALS: Full recovery   NEXT MD VISIT: June 2025   OBJECTIVE:  Note: Objective measures were completed at Evaluation unless otherwise noted.  PATIENT SURVEYS:  LEFS: 20% 04/17/24: 78% (63/80) EDEMA:  Unable to see, still ace wrapped  LOWER EXTREMITY ROM:  A/PROM Right eval Left eval Right   04/23/24  Right  04/28/24 Right  04/30/24 Right  05/06/24 Right  05/13/24   Hip flexion         Hip extension         Hip abduction         Hip adduction         Hip internal rotation         Hip external  rotation         Knee flexion 65 degrees 125 deg  115/NT  117/NT  118/NT 123/NT 125 deg   Knee extension  5 degrees 0 deg    0/NT  0 deg   Knee internal rotation         Knee external rotation         Knee abduction         Knee adduction         Ankle dorsiflexion         Ankle plantarflexion         Ankle inversion         Ankle eversion          (Blank rows = not tested)      MMT   Right 05/13/2022 Left 05/13/2022  Hip flexion 4 4+  Hip extension    Hip abduction 4 4+  Hip adduction    Hip internal rotation    Hip external rotation    Knee flexion 4+ 4  Knee extension 4+ 4  Ankle dorsiflexion    Ankle plantarflexion    Ankle inversion    Ankle eversion     (Blank rows = not tested)           TODAY'S TREATMENT   05/15/24: THEREX  Nu-Step Step with seat at 13 for 5 min    Step Down on 9 inch step onto 2 inch  Single Leg Calf Raise on RLE with BUE support 1 x 10   OMEGA Single Leg Raise on RLE #35 1 x 10   OMEGA Single Leg Raise on RLE #45 3 x 10   Walking Front Lunges with intermittent UE support   3 x 10  -min VC to keep hips level and to keep back heel off ground   Lateral Walking with RLE forward 3 x 10  -min VC to keep toes pointed forward      PATIENT EDUCATION:  Education details: Tissue healing timeline and explanation of car seat position and LE dermatomes  Person educated: Patient and spouse  Education method: Research scientist (medical), deliberate practice, positive reinforcement, explicit instruction, establish rules. Education comprehension: excellent  HOME EXERCISE PROGRAM: Access Code: [facility] URL: [email or web address] Date: 05/15/2024 Prepared by: Toribio Servant  Exercises - Seated Sciatic Nerve Glide With Cervical Motion  - 1 x daily - 7 x weekly - 1 sets - 10 reps - Seated Knee Flexion at Wall  - 1 x daily - 7 x weekly - 1 sets - 10 reps - 30 sec  hold - Supine Sciatic Nerve Glide  - 1 x daily - 7 x weekly - 1 sets  - 10 reps - Knee Flexion  Extension Walk at Wall  - 1 x daily - 7 x weekly - 2 sets - 10 reps - 3 sec hold - Modified Thomas Stretch  - 1 x daily - 7 x weekly - 3 reps - 30- 60 sec hold - Prone Quadriceps Stretch with Strap  - 1 x daily - 7 x weekly - 3 reps - 60 sec  hold - Seated Hip External Rotation Stretch  - 1 x daily - 7 x weekly - 3 reps - 60 sec  hold - Forward Step Down Touch with Heel  - 3-4 x weekly - 2-3 sets - 10 reps - Lateral Step Down  - 3-4 x weekly - 3 sets - 10 reps - Supine Straight-Leg Raise With Resistance at Ankles  - 3-4 x weekly - 3 sets - 10 reps - Walking Forward Lunge  - 3-4 x weekly - 3 sets - 10 reps - Sidestepping  - 3-4 x weekly - 3 sets - 10 reps  ASSESSMENT:  CLINICAL IMPRESSION: Pt is now s/p 11.5 weeks lateral meniscus repair of right knee. He continues to show an improvement in right hip and knee strength with ability to perform forward and lateral lunges with little to no compensation. He does exhibit some hip weakness with hip drop when performing forward lunges, but he is able to recover without much difficulty and maintain stable hips. He will continue to benefit from skilled PT to improve right knee ROM and strength and to return to athletic activity without pain and discomfort to maintain his quality of life and physical fitness.      OBJECTIVE IMPAIRMENTS:  Decreased knowledge of condition, decreased use of DME, decreased mobility, difficulty walking, decreased strength, decreased ROM. ACTIVITY LIMITATIONS: Lifting, standing, walking, squatting, transfers, locomotion level PARTICIPATION LIMITATIONS: Cleaning, laundry, interpersonal relationships, driving, yardwork, community activity.  PERSONAL FACTORS: Age, behavior pattern, education, past/current experiences, transportation, profession  are also affecting patient's functional outcome.  REHAB POTENTIAL: Great  CLINICAL DECISION MAKING: Great EVALUATION COMPLEXITY: Low  GOALS: Goals reviewed  with patient? yes  SHORT TERM GOALS: Target date: 04/05/24 Patient will report comprehension, confidence, and consistent compliance and of a simple home exercise program established to facilitate symptoms management and basic strengthening and/or segment mobility.  Baseline: issued at eval 03/12/24: Able to perform HEP independently  Goal status: ACHIEVED    2.  Patient to demonstrate improved score on self-report measure by >9% to indicate reduced self-reported disability and/or pain.    Baseline: LEFS: 20%   04/17/24: 78% Goal status: ACHIEVED  3.  Pt to demonstrate Rt knee RM 0-90 degrees  Baseline: eval: 5-65 deg 03/20/24: 0-83  04/02/24: 0-90 deg on RLE  Goal status: ACHIEVED   LONG TERM GOALS: Target date: 05/29/24  Pt to demonstrate ability to perform >1544ft without pain limitation, less than 2/10 increase in NPRS to improve ability to participate in IADL and social events.  Baseline: 1,200 ft 04/30/24: 1650 ft   Goal status: ACHIEVED     2.  Pt to demonstrate less than 10% strength deficit in between bilat knee extension and bilat ankle PF to prepare for return to leisure and community activity.  Baseline: Leg Press 1 RM R/L 80/160  Knee Ext 1 RM R/L  55/115  Single Leg Calf Raise R/L partial heel raise/ full heel raise on LLE  Goal status: ONGOING    3.  Patient to demonstrate improved performance on initial transfers assessment AEB improved reps per time or time per  perform desired reps and/or reduced seat height and/or use of hands in order to improve safety, tolerance, and independence in ADL performance.   Baseline: Goal not appropriate for measuring LE endurance for pt's age Goal status: Deferred   4.  Pt to demonstrate <10% difference between single leg hop bilaterally.  Baseline: Not able to perform due to restrictions  Goal status: ONGOING   PLAN:  PT FREQUENCY: 1-2x/week  PT DURATION: 12 weeks PLANNED INTERVENTIONS: 97110-Therapeutic exercises, 97530- Therapeutic  activity, 97112- Neuromuscular re-education, 97535- Self Care, 02859- Manual therapy, [phone]- Gait training, [phone]- Electrical stimulation (unattended), [phone]- Electrical stimulation (manual), Patient/Family education, Balance training, Stair training, Joint mobilization, Joint manipulation, Spinal manipulation, and Spinal mobilization  PLAN FOR NEXT SESSION: Reassess 1 RM goals leg press and knee extension. Continue with heel raises for calf strengthening or OMEGA heel raises on leg press.  Progress step down and side step up. Evaluate hip and knee MMT and update HEP.  Sled push.     Toribio Servant PT, DPT  [facility] [address], [place], [address] Phone: [phone]   Fax:  [phone]

## 2024-05-20 ENCOUNTER — Ambulatory Visit: Admitting: Physical Therapy

## 2024-05-20 ENCOUNTER — Encounter: Payer: Self-pay | Admitting: Physical Therapy

## 2024-05-20 DIAGNOSIS — M25661 Stiffness of right knee, not elsewhere classified: Secondary | ICD-10-CM

## 2024-05-20 DIAGNOSIS — M25561 Pain in right knee: Secondary | ICD-10-CM

## 2024-05-20 DIAGNOSIS — R262 Difficulty in walking, not elsewhere classified: Secondary | ICD-10-CM

## 2024-05-20 NOTE — Therapy (Addendum)
 OUTPATIENT PHYSICAL THERAPY TREATMENT NOTE    Patient Name: Gregory Banks. MRN: [id number] DOB:[birth year], 58 y.o., male Today's Date: 05/20/2024  END OF SESSION:  PT End of Session - 05/20/24 0953     Visit Number 15    Number of Visits 24    Date for PT Re-Evaluation 05/29/24    Authorization Type Medicare primary; [employer] 2ndary    Authorization Time Period 03/06/24-05/29/24    Authorization - Visit Number 15    Authorization - Number of Visits 24    Progress Note Due on Visit 20    PT Start Time 0950    PT Stop Time 1030    PT Time Calculation (min) 40 min    Activity Tolerance Patient tolerated treatment well;No increased pain    Behavior During Therapy WFL for tasks assessed/performed             Past Medical History:  Diagnosis Date   Arthritis    knees, rt shoulder   Hyperlipidemia    Hypertension    MI (myocardial infarction) [facility])    Past Surgical History:  Procedure Laterality Date   CARDIAC CATHETERIZATION     Angioplasty with stent placement x 2   COLONOSCOPY WITH PROPOFOL  N/A 06/01/2017   Procedure: COLONOSCOPY WITH PROPOFOL ;  Surgeon: Therisa Bi, MD;  Location: [facility] ENDOSCOPY;  Service: Gastroenterology;  Laterality: N/A;   KNEE ARTHROSCOPY WITH MENISCAL REPAIR Right 02/26/2024   Procedure: ARTHROSCOPY, KNEE, WITH MENISCUS REPAIR;  Surgeon: Genelle Standing, MD;  Location: [facility];  Service: Orthopedics;  Laterality: Right;  RIGHT KNEE ARTHROSCOPY WITH LATERAL MENISCUS REPAIR   OTHER SURGICAL HISTORY     2 Cardiac stents   TUMOR REMOVAL     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   Patient Active Problem List   Diagnosis Date Noted   Complex tear of lateral meniscus of right knee as current injury 02/26/2024   Hyperlipidemia    MI (myocardial infarction) (HCC)    Intracranial mass 01/27/2015   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   Anxiety 03/04/2014   Chronic coronary artery disease 03/04/2014   Depression 03/04/2014   Hypertension 03/04/2014    Shortness of breath 03/04/2014   PCP: Epifanio Alm SQUIBB, MD  REFERRING PROVIDER: [name] Genelle, MD (orthopedics)  REFERRING DIAG: s/p Rt latera meniscus repair  THERAPY DIAG:  Acute pain of right knee  Stiffness of right knee, not elsewhere classified  Difficulty in walking, not elsewhere classified  Rationale for Evaluation and Treatment: Rehabilitation  ONSET DATE: 02/26/24  SUBJECTIVE:   SUBJECTIVE STATEMENT: Pt states that he definitely notices that the single leg strengthening exercises like lunges are challenging because of his leg strength but he is still able to do them.   PERTINENT HISTORY: Knee pain and locking following a day of playing 4 basketball games, meniscus injury seen. Pt underwent lateral meniscus repair with Dr. Genelle on 6/3, placed in locked in extension, WBAT. At evaluation current pain: 6/10; Best pain: 0/10 Worst Pain: 6/10, on average has been around 4 PAIN:  Are you having pain? 6/10 today, he tweaked his knee getting off the couch today.  PRECAUTIONS: WBAT, locked brace at all times when walking.  WEIGHT BEARING RESTRICTIONS: WBAT  FALLS:  Has patient fallen in last 6 months? No  LIVING ENVIRONMENT: Lives with: Wife Lives in: house  Stairs: 17 stairs to 2nd floor bedroom, no entry stairs  Has following equipment at home: crutches up to 6'6.   OCCUPATION: Retired   PLOF: Coaches basketball  PATIENT GOALS: Full recovery   NEXT MD VISIT: June 2025   OBJECTIVE:  Note: Objective measures were completed at Evaluation unless otherwise noted.  PATIENT SURVEYS:  LEFS: 20% 04/17/24: 78% (63/80) EDEMA:  Unable to see, still ace wrapped  LOWER EXTREMITY ROM:  A/PROM Right eval Left eval Right   04/23/24  Right  04/28/24 Right  04/30/24 Right  05/06/24 Right  05/13/24   Hip flexion         Hip extension         Hip abduction         Hip adduction         Hip internal rotation         Hip external rotation         Knee flexion 65  degrees 125 deg  115/NT  117/NT  118/NT 123/NT 125 deg   Knee extension  5 degrees 0 deg    0/NT  0 deg   Knee internal rotation         Knee external rotation         Knee abduction         Knee adduction         Ankle dorsiflexion         Ankle plantarflexion         Ankle inversion         Ankle eversion          (Blank rows = not tested)      MMT   Right 05/13/2022 Left 05/13/2022  Hip flexion 4 4+  Hip extension    Hip abduction 4 4+  Hip adduction    Hip internal rotation    Hip external rotation    Knee flexion 4+ 4  Knee extension 4+ 4  Ankle dorsiflexion    Ankle plantarflexion    Ankle inversion    Ankle eversion     (Blank rows = not tested)           TODAY'S TREATMENT   05/20/24: THEREX:   Matrix Recumbent Bike with seat at 21 with resistance 3 for 5 min   Forward Step Down on 8 inch step 2 x 10  -min VC to prevent hip drop    1RM Leg Press on RLE -  105 LBS   1RM Knee Ext on RLE- 65 lbs    Single Leg RDL in lunge position #40 KB 3 x 10  -min VC to maintain thoracic extension by looking up    PATIENT EDUCATION:  Education details: Tissue healing timeline and explanation of car seat position and LE dermatomes  Person educated: Patient and spouse  Education method: Research scientist (medical), deliberate practice, positive reinforcement, explicit instruction, establish rules. Education comprehension: excellent  HOME EXERCISE PROGRAM: Access Code: [facility] URL: [email or web address] Date: 05/20/2024 Prepared by: Toribio Servant  Exercises - Seated Sciatic Nerve Glide With Cervical Motion  - 1 x daily - 7 x weekly - 1 sets - 10 reps - Seated Knee Flexion at Wall  - 1 x daily - 7 x weekly - 1 sets - 10 reps - 30 sec  hold - Supine Sciatic Nerve Glide  - 1 x daily - 7 x weekly - 1 sets - 10 reps - Knee Flexion Extension Walk at Wall  - 1 x daily - 7 x weekly - 2 sets - 10 reps - 3 sec hold - Modified Thomas Stretch  - 1 x daily - 7  x weekly -  3 reps - 30- 60 sec hold - Prone Quadriceps Stretch with Strap  - 1 x daily - 7 x weekly - 3 reps - 60 sec  hold - Seated Hip External Rotation Stretch  - 1 x daily - 7 x weekly - 3 reps - 60 sec  hold - Forward Step Down Touch with Heel  - 3-4 x weekly - 2-3 sets - 10 reps - Lateral Step Down  - 3-4 x weekly - 3 sets - 10 reps - Supine Straight-Leg Raise With Resistance at Ankles  - 3-4 x weekly - 3 sets - 10 reps - Walking Forward Lunge  - 3-4 x weekly - 3 sets - 10 reps - Sidestepping  - 3-4 x weekly - 3 sets - 10 reps - Single-Leg [place] Deadlift With Dumbbell  - 3-4 x weekly - 3 sets - 10 reps   ASSESSMENT:  CLINICAL IMPRESSION: Pt is now s/p 12 weeks lateral meniscus repair of right knee. Pt shows ongoing improvement in right knee strength with improved 1 RM for leg press and knee extension. However, he appears to show increased right glute weakness versus quad weakness given improved ability to perform leg press with heel up further on leg press plate or in position that has glute bias. He is very close to initiating plyometrics and he only has some further strengthening to complete before starting. He will continue to benefit from skilled PT to improve right knee ROM and strength and to return to athletic activity without pain and discomfort to maintain his quality of life and physical fitness.     OBJECTIVE IMPAIRMENTS:  Decreased knowledge of condition, decreased use of DME, decreased mobility, difficulty walking, decreased strength, decreased ROM. ACTIVITY LIMITATIONS: Lifting, standing, walking, squatting, transfers, locomotion level PARTICIPATION LIMITATIONS: Cleaning, laundry, interpersonal relationships, driving, yardwork, community activity.  PERSONAL FACTORS: Age, behavior pattern, education, past/current experiences, transportation, profession  are also affecting patient's functional outcome.  REHAB POTENTIAL: Great  CLINICAL DECISION MAKING: Great EVALUATION  COMPLEXITY: Low  GOALS: Goals reviewed with patient? yes  SHORT TERM GOALS: Target date: 04/05/24 Patient will report comprehension, confidence, and consistent compliance and of a simple home exercise program established to facilitate symptoms management and basic strengthening and/or segment mobility.  Baseline: issued at eval 03/12/24: Able to perform HEP independently  Goal status: ACHIEVED    2.  Patient to demonstrate improved score on self-report measure by >9% to indicate reduced self-reported disability and/or pain.    Baseline: LEFS: 20%   04/17/24: 78% Goal status: ACHIEVED  3.  Pt to demonstrate Rt knee RM 0-90 degrees  Baseline: eval: 5-65 deg 03/20/24: 0-83  04/02/24: 0-90 deg on RLE  Goal status: ACHIEVED   LONG TERM GOALS: Target date: 05/29/24  Pt to demonstrate ability to perform >1583ft without pain limitation, less than 2/10 increase in NPRS to improve ability to participate in IADL and social events.  Baseline: 1,200 ft 04/30/24: 1650 ft   Goal status: ACHIEVED     2.  Pt to demonstrate less than 10% strength deficit in between bilat knee extension and bilat ankle PF to prepare for return to leisure and community activity.  Baseline: Leg Press 1 RM R/L 80/160  Knee Ext 1 RM R/L  55/115  Single Leg Calf Raise R/L partial heel raise/ full heel raise on LLE 05/20/24:  Leg Press 1 RM R/L 105/160  Knee Ext 1 RM R/L  65/115  Goal status: ONGOING    3.  Patient to  demonstrate improved performance on initial transfers assessment AEB improved reps per time or time per perform desired reps and/or reduced seat height and/or use of hands in order to improve safety, tolerance, and independence in ADL performance.   Baseline: Goal not appropriate for measuring LE endurance for pt's age Goal status: Deferred   4.  Pt to demonstrate <10% difference between single leg hop bilaterally.  Baseline: Not able to perform due to restrictions  Goal status: ONGOING   PLAN:  PT FREQUENCY:  1-2x/week  PT DURATION: 12 weeks PLANNED INTERVENTIONS: 97110-Therapeutic exercises, 97530- Therapeutic activity, 97112- Neuromuscular re-education, 97535- Self Care, 02859- Manual therapy, [phone]- Gait training, [phone]- Electrical stimulation (unattended), [phone]- Electrical stimulation (manual), Patient/Family education, Balance training, Stair training, Joint mobilization, Joint manipulation, Spinal manipulation, and Spinal mobilization  PLAN FOR NEXT SESSION: TRX deep squats to full depth and TRX reverse lunges. Heel raises with extended knee on leg press-single leg. OMEGA Single Leg Press with heel further down on plate for glute bias. Sled Push and single leg stance on RLE on foam    Toribio Servant PT, DPT  [facility] [address] [place], [address] Phone: [phone]   Fax:  [phone]

## 2024-05-21 NOTE — Therapy (Signed)
 OUTPATIENT PHYSICAL THERAPY TREATMENT NOTE    Patient Name: Gregory Banks. MRN: [id number] DOB:08-22-1966, 58 y.o., male Today's Date: 05/22/2024  END OF SESSION:  PT End of Session - 05/22/24 0947     Visit Number 16    Number of Visits 24    Date for PT Re-Evaluation 05/29/24    Authorization Type Medicare primary; [employer] commercial 2ndary    Authorization Time Period 03/06/24-05/29/24    Authorization - Number of Visits 24    Progress Note Due on Visit 20    PT Start Time 0945    PT Stop Time 1027    PT Time Calculation (min) 42 min    Activity Tolerance Patient tolerated treatment well;No increased pain    Behavior During Therapy WFL for tasks assessed/performed              Past Medical History:  Diagnosis Date   Arthritis    knees, rt shoulder   Hyperlipidemia    Hypertension    MI (myocardial infarction) [facility])    Past Surgical History:  Procedure Laterality Date   CARDIAC CATHETERIZATION     Angioplasty with stent placement x 2   COLONOSCOPY WITH PROPOFOL  N/A 06/01/2017   Procedure: COLONOSCOPY WITH PROPOFOL ;  Surgeon: Therisa Bi, MD;  Location: [facility] ENDOSCOPY;  Service: Gastroenterology;  Laterality: N/A;   KNEE ARTHROSCOPY WITH MENISCAL REPAIR Right 02/26/2024   Procedure: ARTHROSCOPY, KNEE, WITH MENISCUS REPAIR;  Surgeon: Genelle Standing, MD;  Location: [facility];  Service: Orthopedics;  Laterality: Right;  RIGHT KNEE ARTHROSCOPY WITH LATERAL MENISCUS REPAIR   OTHER SURGICAL HISTORY     2 Cardiac stents   TUMOR REMOVAL     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   Patient Active Problem List   Diagnosis Date Noted   Complex tear of lateral meniscus of right knee as current injury 02/26/2024   Hyperlipidemia    MI (myocardial infarction) (HCC)    Intracranial mass 01/27/2015   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   Anxiety 03/04/2014   Chronic coronary artery disease 03/04/2014   Depression 03/04/2014   Hypertension 03/04/2014   Shortness of breath 03/04/2014    PCP: Epifanio Alm SQUIBB, MD  REFERRING PROVIDER: [name] Genelle, MD (orthopedics)  REFERRING DIAG: s/p Rt latera meniscus repair  THERAPY DIAG:  Acute pain of right knee  Stiffness of right knee, not elsewhere classified  Difficulty in walking, not elsewhere classified  Rationale for Evaluation and Treatment: Rehabilitation  ONSET DATE: 02/26/24  SUBJECTIVE:   SUBJECTIVE STATEMENT:   Denies any new complaints this session. A little sore from last session   PERTINENT HISTORY: Knee pain and locking following a day of playing 4 basketball games, meniscus injury seen. Pt underwent lateral meniscus repair with Dr. Genelle on 6/3, placed in locked in extension, WBAT. At evaluation current pain: 6/10; Best pain: 0/10 Worst Pain: 6/10, on average has been around 4 PAIN:  Are you having pain? 6/10 today, he tweaked his knee getting off the couch today.  PRECAUTIONS: WBAT, locked brace at all times when walking.  WEIGHT BEARING RESTRICTIONS: WBAT  FALLS:  Has patient fallen in last 6 months? No  LIVING ENVIRONMENT: Lives with: Wife Lives in: house  Stairs: 17 stairs to 2nd floor bedroom, no entry stairs  Has following equipment at home: crutches up to 6'6.   OCCUPATION: Retired   PLOF: Coaches basketball   PATIENT GOALS: Full recovery   NEXT MD VISIT: June 2025   OBJECTIVE:  Note: Objective measures were completed  at Evaluation unless otherwise noted.  PATIENT SURVEYS:  LEFS: 20% 04/17/24: 78% (63/80) EDEMA:  Unable to see, still ace wrapped  LOWER EXTREMITY ROM:  A/PROM Right eval Left eval Right   04/23/24  Right  04/28/24 Right  04/30/24 Right  05/06/24 Right  05/13/24   Hip flexion         Hip extension         Hip abduction         Hip adduction         Hip internal rotation         Hip external rotation         Knee flexion 65 degrees 125 deg  115/NT  117/NT  118/NT 123/NT 125 deg   Knee extension  5 degrees 0 deg    0/NT  0 deg   Knee internal rotation          Knee external rotation         Knee abduction         Knee adduction         Ankle dorsiflexion         Ankle plantarflexion         Ankle inversion         Ankle eversion          (Blank rows = not tested)      MMT   Right 05/13/2022 Left 05/13/2022  Hip flexion 4 4+  Hip extension    Hip abduction 4 4+  Hip adduction    Hip internal rotation    Hip external rotation    Knee flexion 4+ 4  Knee extension 4+ 4  Ankle dorsiflexion    Ankle plantarflexion    Ankle inversion    Ankle eversion     (Blank rows = not tested)     TODAY'S TREATMENT  05/22/24 Matrix Recumbent Bike with seat at 21 with resistance 3 for 5 min   Matrix hip abduction 85# 2 x 12 each LE - machine height set to 7 Matrix hip flexion 85# x 12. 100# x 12 each LE Matrix hip extension 85# 2 x 12 each LE  TRX deep squat 2 x 12  TRX reverse lunges 2 x 12 each LE  Fwd step down 6 step 2 x 12 each LE  Lateral step down 6 step x 12 each LE   PATIENT EDUCATION:  Education details: Tissue healing timeline and explanation of car seat position and LE dermatomes  Person educated: Patient and spouse  Education method: Research scientist (medical), deliberate practice, positive reinforcement, explicit instruction, establish rules. Education comprehension: excellent  HOME EXERCISE PROGRAM: Access Code: [facility] URL: [email or web address] Date: 05/20/2024 Prepared by: Toribio Servant  Exercises - Seated Sciatic Nerve Glide With Cervical Motion  - 1 x daily - 7 x weekly - 1 sets - 10 reps - Seated Knee Flexion at Wall  - 1 x daily - 7 x weekly - 1 sets - 10 reps - 30 sec  hold - Supine Sciatic Nerve Glide  - 1 x daily - 7 x weekly - 1 sets - 10 reps - Knee Flexion Extension Walk at Wall  - 1 x daily - 7 x weekly - 2 sets - 10 reps - 3 sec hold - Modified Thomas Stretch  - 1 x daily - 7 x weekly - 3 reps - 30- 60 sec hold - Prone Quadriceps Stretch with Strap  - 1 x daily - 7 x weekly -  3 reps -  60 sec  hold - Seated Hip External Rotation Stretch  - 1 x daily - 7 x weekly - 3 reps - 60 sec  hold - Forward Step Down Touch with Heel  - 3-4 x weekly - 2-3 sets - 10 reps - Lateral Step Down  - 3-4 x weekly - 3 sets - 10 reps - Supine Straight-Leg Raise With Resistance at Ankles  - 3-4 x weekly - 3 sets - 10 reps - Walking Forward Lunge  - 3-4 x weekly - 3 sets - 10 reps - Sidestepping  - 3-4 x weekly - 3 sets - 10 reps - Single-Leg [place] Deadlift With Dumbbell  - 3-4 x weekly - 3 sets - 10 reps   ASSESSMENT:  CLINICAL IMPRESSION:   Pt is now s/p 12 weeks lateral meniscus repair of right knee. Session focused on hip and quad strengthening. Tolerated session well with no increase in pain. He will continue to benefit from skilled PT to improve right knee ROM and strength and to return to athletic activity without pain and discomfort to maintain his quality of life and physical fitness.     OBJECTIVE IMPAIRMENTS:  Decreased knowledge of condition, decreased use of DME, decreased mobility, difficulty walking, decreased strength, decreased ROM. ACTIVITY LIMITATIONS: Lifting, standing, walking, squatting, transfers, locomotion level PARTICIPATION LIMITATIONS: Cleaning, laundry, interpersonal relationships, driving, yardwork, community activity.  PERSONAL FACTORS: Age, behavior pattern, education, past/current experiences, transportation, profession  are also affecting patient's functional outcome.  REHAB POTENTIAL: Great  CLINICAL DECISION MAKING: Great EVALUATION COMPLEXITY: Low  GOALS: Goals reviewed with patient? yes  SHORT TERM GOALS: Target date: 04/05/24 Patient will report comprehension, confidence, and consistent compliance and of a simple home exercise program established to facilitate symptoms management and basic strengthening and/or segment mobility.  Baseline: issued at eval 03/12/24: Able to perform HEP independently  Goal status: ACHIEVED    2.  Patient to demonstrate  improved score on self-report measure by >9% to indicate reduced self-reported disability and/or pain.    Baseline: LEFS: 20%   04/17/24: 78% Goal status: ACHIEVED  3.  Pt to demonstrate Rt knee RM 0-90 degrees  Baseline: eval: 5-65 deg 03/20/24: 0-83  04/02/24: 0-90 deg on RLE  Goal status: ACHIEVED   LONG TERM GOALS: Target date: 05/29/24  Pt to demonstrate ability to perform >158ft without pain limitation, less than 2/10 increase in NPRS to improve ability to participate in IADL and social events.  Baseline: 1,200 ft 04/30/24: 1650 ft   Goal status: ACHIEVED     2.  Pt to demonstrate less than 10% strength deficit in between bilat knee extension and bilat ankle PF to prepare for return to leisure and community activity.  Baseline: Leg Press 1 RM R/L 80/160  Knee Ext 1 RM R/L  55/115  Single Leg Calf Raise R/L partial heel raise/ full heel raise on LLE 05/20/24:  Leg Press 1 RM R/L 105/160  Knee Ext 1 RM R/L  65/115  Goal status: ONGOING    3.  Patient to demonstrate improved performance on initial transfers assessment AEB improved reps per time or time per perform desired reps and/or reduced seat height and/or use of hands in order to improve safety, tolerance, and independence in ADL performance.   Baseline: Goal not appropriate for measuring LE endurance for pt's age Goal status: Deferred   4.  Pt to demonstrate <10% difference between single leg hop bilaterally.  Baseline: Not able to perform due to  restrictions  Goal status: ONGOING   PLAN:  PT FREQUENCY: 1-2x/week  PT DURATION: 12 weeks PLANNED INTERVENTIONS: 97110-Therapeutic exercises, 97530- Therapeutic activity, [id number]- Neuromuscular re-education, [id number]- Self Care, 02859- Manual therapy, [phone]- Gait training, [phone]- Electrical stimulation (unattended), [phone]- Electrical stimulation (manual), Patient/Family education, Balance training, Stair training, Joint mobilization, Joint manipulation, Spinal manipulation, and Spinal  mobilization  PLAN FOR NEXT SESSION: TRX deep squats to full depth and TRX reverse lunges. Heel raises with extended knee on leg press-single leg. OMEGA Single Leg Press with heel further down on plate for glute bias. Sled Push and single leg stance on RLE on foam    Maryanne Finder, PT, DPT Physical Therapist - [place]  [facility]

## 2024-05-22 ENCOUNTER — Encounter: Payer: Self-pay | Admitting: Physical Therapy

## 2024-05-22 ENCOUNTER — Ambulatory Visit

## 2024-05-22 DIAGNOSIS — R262 Difficulty in walking, not elsewhere classified: Secondary | ICD-10-CM

## 2024-05-22 DIAGNOSIS — M25661 Stiffness of right knee, not elsewhere classified: Secondary | ICD-10-CM

## 2024-05-22 DIAGNOSIS — M25561 Pain in right knee: Secondary | ICD-10-CM | POA: Diagnosis not present

## 2024-05-27 ENCOUNTER — Encounter: Payer: Self-pay | Admitting: Physical Therapy

## 2024-05-27 ENCOUNTER — Ambulatory Visit: Attending: Orthopaedic Surgery | Admitting: Physical Therapy

## 2024-05-27 DIAGNOSIS — M25661 Stiffness of right knee, not elsewhere classified: Secondary | ICD-10-CM | POA: Diagnosis present

## 2024-05-27 DIAGNOSIS — M25561 Pain in right knee: Secondary | ICD-10-CM | POA: Diagnosis present

## 2024-05-27 DIAGNOSIS — R262 Difficulty in walking, not elsewhere classified: Secondary | ICD-10-CM | POA: Diagnosis present

## 2024-05-27 NOTE — Therapy (Addendum)
 OUTPATIENT PHYSICAL THERAPY RE-CERTIFICATION      Patient Name: Gregory Banks. 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    Authorization Time Period 03/06/24-05/29/24    Authorization - Visit Number 16    Authorization - Number of Visits 24    Progress Note Due on Visit 20    PT Start Time 1350    PT Stop Time 1430    PT Time Calculation (min) 40 min    Activity Tolerance Patient tolerated treatment well;No increased pain    Behavior During Therapy WFL for tasks assessed/performed              Past Medical History:  Diagnosis Date   Arthritis    knees, rt shoulder   Hyperlipidemia    Hypertension    MI (myocardial infarction) [facility])    Past Surgical History:  Procedure Laterality Date   CARDIAC CATHETERIZATION     Angioplasty with stent placement x 2   COLONOSCOPY WITH PROPOFOL  N/A 06/01/2017   Procedure: COLONOSCOPY WITH PROPOFOL ;  Surgeon: Therisa Bi, MD;  Location: [facility] ENDOSCOPY;  Service: Gastroenterology;  Laterality: N/A;   KNEE ARTHROSCOPY WITH MENISCAL REPAIR Right 02/26/2024   Procedure: ARTHROSCOPY, KNEE, WITH MENISCUS REPAIR;  Surgeon: Genelle Standing, MD;  Location: [facility];  Service: Orthopedics;  Laterality: Right;  RIGHT KNEE ARTHROSCOPY WITH LATERAL MENISCUS REPAIR   OTHER SURGICAL HISTORY     2 Cardiac stents   TUMOR REMOVAL     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   Patient Active Problem List   Diagnosis Date Noted   Complex tear of lateral meniscus of right knee as current injury 02/26/2024   Hyperlipidemia    MI (myocardial infarction) (HCC)    Intracranial mass 01/27/2015   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   Anxiety 03/04/2014   Chronic coronary artery disease 03/04/2014   Depression 03/04/2014   Hypertension  03/04/2014   Shortness of breath 03/04/2014   PCP: Epifanio Alm SQUIBB, MD  REFERRING PROVIDER: [name] Genelle, MD (orthopedics)  REFERRING DIAG: s/p Rt latera meniscus repair  THERAPY DIAG:  Acute pain of right knee  Stiffness of right knee, not elsewhere classified  Difficulty in walking, not elsewhere classified  Rationale for Evaluation and Treatment: Rehabilitation  ONSET DATE: 02/26/24  SUBJECTIVE:   SUBJECTIVE STATEMENT:   Pt states that he continues to do well and the only exercise that he finds that is difficult is the forward step down.    PERTINENT HISTORY: Knee pain and locking following a day of playing 4 basketball games, meniscus injury seen. Pt underwent lateral meniscus repair with Dr. Genelle on 6/3, placed in locked in extension, WBAT. At evaluation current pain: 6/10; Best pain: 0/10 Worst Pain: 6/10, on average has been around 4 PAIN:  Are you having pain? 6/10 today, he tweaked his knee getting off the couch today.  PRECAUTIONS: WBAT, locked brace at all times when walking.  WEIGHT BEARING RESTRICTIONS: WBAT  FALLS:  Has patient fallen in last 6 months? No  LIVING ENVIRONMENT: Lives with: Wife Lives in: house  Stairs: 17 stairs to 2nd floor bedroom, no entry stairs  Has following equipment at home: crutches up to 6'6.   OCCUPATION: Retired   PLOF: Coaches basketball  PATIENT GOALS: Full recovery   NEXT MD VISIT: June 2025   OBJECTIVE:  Note: Objective measures were completed at Evaluation unless otherwise noted.  PATIENT SURVEYS:  LEFS: 20% 04/17/24: 78% (63/80) EDEMA:  Unable to see, still ace wrapped  LOWER EXTREMITY ROM:  A/PROM Right eval Left eval Right   04/23/24  Right  04/28/24 Right  04/30/24 Right  05/06/24 Right  05/13/24   Hip flexion         Hip extension         Hip abduction         Hip adduction         Hip internal rotation         Hip external rotation         Knee flexion 65 degrees 125 deg  115/NT  117/NT  118/NT  123/NT 125 deg   Knee extension  5 degrees 0 deg    0/NT  0 deg   Knee internal rotation         Knee external rotation         Knee abduction         Knee adduction         Ankle dorsiflexion         Ankle plantarflexion         Ankle inversion         Ankle eversion          (Blank rows = not tested)      MMT   Right 05/13/2022 Left 05/13/2022  Hip flexion 4 4+  Hip extension    Hip abduction 4 4+  Hip adduction    Hip internal rotation    Hip external rotation    Knee flexion 4+ 4  Knee extension 4+ 4  Ankle dorsiflexion    Ankle plantarflexion    Ankle inversion    Ankle eversion     (Blank rows = not tested)     TODAY'S TREATMENT  05/27/24 THEREX    Nu-Step with seat and arms at 11 with resistance 2 for 6 min   Knee Flex AROM R/L 120/123   Single Leg Sit to Stand on RLE from 28 2 x 10    Front Step Down on 6 inch step with intermittent UE support 2 x 10   -Pt shows improved eccentric control    Sled Push Forward with #35- 30 ft x 10   Sled Push Backward with #45 x 2-  30 ft x 10   Double limb jump  2 x 10  -Limited clearance from ground    OMEGA Calf Raise with extended knee on RLE #45 1 x 10   OMEGA Calf Raise with extended knee on RLE #55 1 x 10   OMEGA Calf Raise with extended knee on RLE #65 1 x 10   OMEGA  Single Leg Press  #45 on RLE 1 x 10    OMEGA  Single Leg Press #55 on RLE 1 x 10   OMEGA Single Leg Press #65 on RLE 1 x 10  -min VC to decrease speed of eccentric phase      PATIENT EDUCATION:  Education details: Tissue healing timeline and explanation of car seat position and LE dermatomes  Person educated: Patient and spouse  Education method: Research scientist (medical), deliberate practice, positive reinforcement, explicit instruction, establish rules. Education comprehension: excellent  HOME EXERCISE PROGRAM: Access Code: [facility] URL: [email or web address] Date: 05/20/2024 Prepared by: Toribio Servant  Exercises -  Seated Sciatic Nerve Glide With Cervical Motion  - 1 x daily - 7 x weekly - 1 sets - 10 reps - Seated Knee Flexion at Wall  - 1 x daily - 7 x weekly - 1 sets - 10 reps - 30 sec  hold - Supine Sciatic Nerve Glide  - 1 x daily - 7 x weekly - 1 sets - 10 reps - Knee Flexion Extension Walk at Wall  - 1 x daily - 7 x weekly - 2 sets - 10 reps - 3 sec hold - Modified Thomas Stretch  - 1 x daily - 7 x weekly - 3 reps - 30- 60 sec hold - Prone Quadriceps Stretch with Strap  - 1 x daily - 7 x weekly - 3 reps - 60 sec  hold - Seated Hip External Rotation Stretch  - 1 x daily - 7 x weekly - 3 reps - 60 sec  hold - Forward Step Down Touch with Heel  - 3-4 x weekly - 2-3 sets - 10 reps - Lateral Step Down  - 3-4 x weekly - 3 sets - 10 reps - Supine Straight-Leg Raise With Resistance at Ankles  - 3-4 x weekly - 3 sets - 10 reps - Walking Forward Lunge  - 3-4 x weekly - 3 sets - 10 reps - Sidestepping  - 3-4 x weekly - 3 sets - 10 reps - Single-Leg [place] Deadlift With Dumbbell  - 3-4 x weekly - 3 sets - 10 reps   ASSESSMENT:  CLINICAL IMPRESSION:   Pt is now s/p 13 weeks lateral meniscus repair of right knee. He continues to show improvements in right knee and hip strength with ability to perform single leg strengthening exercises with increased resistance and with greater eccentric control. He does show strength deficits when performing jumps with limited jumping height and with him fatiguing quickly. He is nearing the return to running portion of the protocol but he still needs further RLE strengthening to be able to safely jog or perform single leg hop. He will continue to benefit from skilled PT to improve right knee ROM and strength and to return to athletic activity without pain and discomfort to maintain his quality of life and physical fitness.     OBJECTIVE IMPAIRMENTS:  Decreased knowledge of condition, decreased use of DME, decreased mobility, difficulty walking, decreased strength, decreased  ROM. ACTIVITY LIMITATIONS: Lifting, standing, walking, squatting, transfers, locomotion level PARTICIPATION LIMITATIONS: Cleaning, laundry, interpersonal relationships, driving, yardwork, community activity.  PERSONAL FACTORS: Age, behavior pattern, education, past/current experiences, transportation, profession  are also affecting patient's functional outcome.  REHAB POTENTIAL: Great  CLINICAL DECISION MAKING: Great EVALUATION COMPLEXITY: Low  GOALS: Goals reviewed with patient? yes  SHORT TERM GOALS: Target date: 04/05/24 Patient will report comprehension, confidence, and consistent compliance and of a simple home exercise program established to facilitate symptoms management and basic strengthening and/or segment mobility.  Baseline: issued at eval 03/12/24: Able to perform HEP independently  Goal status: ACHIEVED    2.  Patient to demonstrate improved score on self-report measure by >9% to indicate reduced self-reported disability and/or pain.    Baseline: LEFS: 20%   04/17/24: 78% Goal status: ACHIEVED  3.  Pt to demonstrate Rt knee RM 0-90 degrees  Baseline: eval: 5-65 deg 03/20/24: 0-83  04/02/24: 0-90 deg on RLE  Goal status: ACHIEVED   LONG TERM GOALS: Target date: 05/29/24  Pt to demonstrate ability to perform >1540ft without pain limitation, less than 2/10 increase  in NPRS to improve ability to participate in IADL and social events.  Baseline: 1,200 ft 04/30/24: 1650 ft   Goal status: ACHIEVED     2.  Pt to demonstrate less than 10% strength deficit in between bilat knee extension and bilat ankle PF to prepare for return to leisure and community activity.  Baseline: Leg Press 1 RM R/L 80/160  Knee Ext 1 RM R/L  55/115  Single Leg Calf Raise R/L partial heel raise/ full heel raise on LLE 05/20/24:  Leg Press 1 RM R/L 105/160  Knee Ext 1 RM R/L  65/115  Goal status: ONGOING    3.  Patient to demonstrate improved performance on initial transfers assessment AEB improved reps per  time or time per perform desired reps and/or reduced seat height and/or use of hands in order to improve safety, tolerance, and independence in ADL performance.   Baseline: Goal not appropriate for measuring LE endurance for pt's age Goal status: Deferred   4.  Pt to demonstrate <10% difference between single leg hop bilaterally.  Baseline: Not able to perform due to restrictions  Goal status: ONGOING   PLAN:  PT FREQUENCY: 1-2x/week  PT DURATION: 12 weeks PLANNED INTERVENTIONS: 97110-Therapeutic exercises, 97530- Therapeutic activity, 97112- Neuromuscular re-education, 97535- Self Care, 02859- Manual therapy, [phone]- Gait training, [phone]- Electrical stimulation (unattended), [phone]- Electrical stimulation (manual), Patient/Family education, Balance training, Stair training, Joint mobilization, Joint manipulation, Spinal manipulation, and Spinal mobilization  PLAN FOR NEXT SESSION: [place] split squat on RLE. Lateral side steps with resistance and monster walks. Continue with use of OMEGA machine with HS curl, Leg Press and Knee Ext.    Toribio Servant PT, DPT  [facility] [address], [place], [address] Phone: [phone]   Fax:  [phone]

## 2024-05-27 NOTE — Addendum Note (Signed)
 Addended by: THEOTIS TORIBIO PARAS on: 05/27/2024 10:21 PM   Modules accepted: Orders

## 2024-05-30 ENCOUNTER — Ambulatory Visit (HOSPITAL_BASED_OUTPATIENT_CLINIC_OR_DEPARTMENT_OTHER)

## 2024-05-30 ENCOUNTER — Other Ambulatory Visit (HOSPITAL_BASED_OUTPATIENT_CLINIC_OR_DEPARTMENT_OTHER): Payer: Self-pay

## 2024-05-30 ENCOUNTER — Ambulatory Visit (INDEPENDENT_AMBULATORY_CARE_PROVIDER_SITE_OTHER): Admitting: Student

## 2024-05-30 ENCOUNTER — Telehealth: Payer: Self-pay

## 2024-05-30 DIAGNOSIS — M25561 Pain in right knee: Secondary | ICD-10-CM | POA: Diagnosis not present

## 2024-05-30 DIAGNOSIS — M25551 Pain in right hip: Secondary | ICD-10-CM | POA: Diagnosis not present

## 2024-05-30 MED ORDER — MELOXICAM 15 MG PO TABS
15.0000 mg | ORAL_TABLET | Freq: Every day | ORAL | 0 refills | Status: AC
  Filled 2024-05-30: qty 14, 14d supply, fill #0

## 2024-05-30 NOTE — Progress Notes (Signed)
 Chief Complaint: Right knee and left hip pain     History of Present Illness:    Gregory Alred. is a 58 y.o. male who presents today for evaluation of right knee and left hip pain after a fall this morning.  Patient was on a walk and slipped on a patch of mud, causing his left leg to slip out in front of them and subsequently fell onto the ground onto his right knee.  He has been progressing with physical therapy well after a meniscal repair with Dr. Genelle in June.  Patient was also seen in clinic on 8/7 and received a right hip joint injection which has been giving him great relief.  Patient reports that his right knee is swollen and he has had some discomfort with weightbearing.  He does also have some pain in the back of the left hip/low back area which does not radiate.   Surgical History:   Right knee arthroscopy with meniscal repair 02/26/2024  PMH/PSH/Family History/Social History/Meds/Allergies:    Past Medical History:  Diagnosis Date   Arthritis    knees, rt shoulder   Hyperlipidemia    Hypertension    MI (myocardial infarction) [facility])    Past Surgical History:  Procedure Laterality Date   CARDIAC CATHETERIZATION     Angioplasty with stent placement x 2   COLONOSCOPY WITH PROPOFOL  N/A 06/01/2017   Procedure: COLONOSCOPY WITH PROPOFOL ;  Surgeon: Therisa Bi, MD;  Location: [facility] ENDOSCOPY;  Service: Gastroenterology;  Laterality: N/A;   KNEE ARTHROSCOPY WITH MENISCAL REPAIR Right 02/26/2024   Procedure: ARTHROSCOPY, KNEE, WITH MENISCUS REPAIR;  Surgeon: Genelle Standing, MD;  Location: [facility];  Service: Orthopedics;  Laterality: Right;  RIGHT KNEE ARTHROSCOPY WITH LATERAL MENISCUS REPAIR   OTHER SURGICAL HISTORY     2 Cardiac stents   TUMOR REMOVAL     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   Social History   Socioeconomic History   Marital status: Married    Spouse name: Not on file   Number of children: Not on file   Years of  education: Not on file   Highest education level: Not on file  Occupational History   Not on file  Tobacco Use   Smoking status: Never   Smokeless tobacco: Never  Substance and Sexual Activity   Alcohol use: Yes    Comment: social   Drug use: No   Sexual activity: Yes  Other Topics Concern   Not on file  Social History Narrative   Not on file   Social Drivers of Health   Financial Resource Strain: Low Risk  (10/26/2023)   [facility]   Overall Financial Resource Strain (CARDIA)    Difficulty of Paying Living Expenses: Not hard at all  Food Insecurity: No Food Insecurity (10/26/2023)   [facility]   Hunger Vital Sign    Within the past 12 months, you worried that your food would run out before you got the money to buy more.: Never true    Within the past 12 months, the food you bought just didn't last and you didn't have money to get more.: Never true  Transportation Needs: No Transportation Needs (10/26/2023)   Received from [facility]   [facility] - Transportation  In the past 12 months, has lack of transportation kept you from medical appointments or from getting medications?: No    Lack of Transportation (Non-Medical): No  Physical Activity: Not on file  Stress: Not on file  Social Connections: Not on file   No family history on file. Allergies  Allergen Reactions   Oxycodone -Acetaminophen  Nausea And Vomiting   Current Outpatient Medications  Medication Sig Dispense Refill   meloxicam  (MOBIC ) 15 MG tablet Take 1 tablet (15 mg total) by mouth daily for 14 days. 14 tablet 0   aspirin  EC 325 MG tablet Take 1 tablet (325 mg total) by mouth daily. 30 tablet 0   lisinopril (PRINIVIL,ZESTRIL) 20 MG tablet Take 10 mg by mouth daily.     Metoprolol Succinate 25 MG CS24 Take 25 mg by mouth daily.     Multiple Vitamin (MULTIVITAMIN WITH MINERALS) TABS tablet Take 1 tablet by mouth daily.      nitroGLYCERIN (NITROSTAT) 0.4 MG SL tablet Place under the tongue.     oxycodone  (OXY-IR) 5 MG capsule Take 1 capsule (5 mg total) by mouth every 4 (four) hours as needed (severe pain). 10 capsule 0   oxyCODONE  (ROXICODONE ) 5 MG immediate release tablet Take 1 tablet (5 mg total) by mouth every 4 (four) hours as needed for severe pain (pain score 7-10) or breakthrough pain. 10 tablet 0   pravastatin (PRAVACHOL) 20 MG tablet Take 20 mg by mouth at bedtime.     ticagrelor (BRILINTA) 90 MG TABS tablet Take by mouth 2 (two) times daily.     No current facility-administered medications for this visit.   No results found.  Review of Systems:   A ROS was performed including pertinent positives and negatives as documented in the HPI.  Physical Exam :   Constitutional: NAD and appears stated age Neurological: Alert and oriented Psych: Appropriate affect and cooperative There were no vitals taken for this visit.   Comprehensive Musculoskeletal Exam:    Exam of the right knee demonstrates presence of a mild to moderate effusion.  Prior arthroscopic incisions are well-appearing.  No significant medial or lateral joint line tenderness.  There is some discomfort anteriorly around the patellar tendon with some mild fluctuant swelling around the infrapatellar bursa. No midline tenderness palpation of the lumbar spine.  Imaging:   Xray (right knee 4 views): Negative for acute fracture or dislocation. Effusion present. Mild to moderate tricompartmental degenerative changes.  Xray (lumbar spine 4 views): No acute abnormality seen. Overall well-maintained disc spacing with some mild lower facet arthropathy. Anterior osteophyte off the L3 vertebral body.   I personally reviewed and interpreted the radiographs.   Assessment:   58 y.o. male who sustained a fall earlier today resulting in pain to his recently surgical right knee as well as his left hip.  Right knee does have an effusion on exam today  although there are no signs of instability or joint line pain.  X-ray is negative for bony pathology.  I would like to try to get this calm down with anti-inflammatories and RICE therapy and will evaluate within 2 weeks if symptoms have not significantly improved.  Pain in the left hip is more posteriorly in the lower lumbar region which appears consistent with a mild lumbar strain.  He recently got an injection into the left hip joint so I am hopeful that this will not interfere with the good relief he has been getting.  Will start meloxicam  today for 2 weeks.  Follow-up in  2 weeks if symptoms do not improve or resolve.  Plan :    - Start meloxicam  15 mg - Return to clinic as needed     I personally saw and evaluated the patient, and participated in the management and treatment plan.  Leonce Reveal, PA-C Orthopedics

## 2024-05-30 NOTE — Telephone Encounter (Signed)
 Patient was on a walk with his wife about 30 minutes ago and fell. His wife is going to call and see if he can be checked out today.

## 2024-06-02 ENCOUNTER — Ambulatory Visit: Admitting: Physical Therapy

## 2024-06-05 ENCOUNTER — Ambulatory Visit: Admitting: Physical Therapy

## 2024-06-16 ENCOUNTER — Encounter: Payer: Self-pay | Admitting: Physical Therapy

## 2024-06-16 ENCOUNTER — Ambulatory Visit: Admitting: Physical Therapy

## 2024-06-16 DIAGNOSIS — M25661 Stiffness of right knee, not elsewhere classified: Secondary | ICD-10-CM

## 2024-06-16 DIAGNOSIS — M25561 Pain in right knee: Secondary | ICD-10-CM | POA: Diagnosis not present

## 2024-06-16 DIAGNOSIS — R262 Difficulty in walking, not elsewhere classified: Secondary | ICD-10-CM

## 2024-06-16 NOTE — Therapy (Signed)
 OUTPATIENT PHYSICAL THERAPY TREATMENT    Patient Name: Gregory Banks. MRN: [id number] DOB:1966/04/29, 58 y.o., male Today's Date: 06/16/2024  END OF SESSION:  PT End of Session - 06/16/24 0954     Visit Number 17    Number of Visits 24    Date for Recertification  08/22/24    Authorization Type Medicare primary; eneric commercial 2ndary    Authorization Time Period --    Authorization - Visit Number 17    Authorization - Number of Visits 24    Progress Note Due on Visit 20    PT Start Time 0950    PT Stop Time 1030    PT Time Calculation (min) 40 min    Activity Tolerance Patient tolerated treatment well;No increased pain    Behavior During Therapy WFL for tasks assessed/performed              Past Medical History:  Diagnosis Date   Arthritis    knees, rt shoulder   Hyperlipidemia    Hypertension    MI (myocardial infarction) [facility])    Past Surgical History:  Procedure Laterality Date   CARDIAC CATHETERIZATION     Angioplasty with stent placement x 2   COLONOSCOPY WITH PROPOFOL  N/A 06/01/2017   Procedure: COLONOSCOPY WITH PROPOFOL ;  Surgeon: Therisa Bi, MD;  Location: [facility] ENDOSCOPY;  Service: Gastroenterology;  Laterality: N/A;   KNEE ARTHROSCOPY WITH MENISCAL REPAIR Right 02/26/2024   Procedure: ARTHROSCOPY, KNEE, WITH MENISCUS REPAIR;  Surgeon: Genelle Standing, MD;  Location: [facility];  Service: Orthopedics;  Laterality: Right;  RIGHT KNEE ARTHROSCOPY WITH LATERAL MENISCUS REPAIR   OTHER SURGICAL HISTORY     2 Cardiac stents   TUMOR REMOVAL     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   Patient Active Problem List   Diagnosis Date Noted   Complex tear of lateral meniscus of right knee as current injury 02/26/2024   Hyperlipidemia    MI (myocardial infarction) (HCC)    Intracranial mass 01/27/2015   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   Anxiety 03/04/2014   Chronic coronary artery disease 03/04/2014   Depression 03/04/2014   Hypertension 03/04/2014   Shortness of  breath 03/04/2014   PCP: Epifanio Alm SQUIBB, MD  REFERRING PROVIDER: [name] Genelle, MD (orthopedics)  REFERRING DIAG: s/p Rt latera meniscus repair  THERAPY DIAG:  Acute pain of right knee  Stiffness of right knee, not elsewhere classified  Difficulty in walking, not elsewhere classified  Rationale for Evaluation and Treatment: Rehabilitation  ONSET DATE: 02/26/24  SUBJECTIVE:   SUBJECTIVE STATEMENT:   Pt reports decreased swelling in his left knee after sustaining a fall slipping on mud.    PERTINENT HISTORY: Knee pain and locking following a day of playing 4 basketball games, meniscus injury seen. Pt underwent lateral meniscus repair with Dr. Genelle on 6/3, placed in locked in extension, WBAT. At evaluation current pain: 6/10; Best pain: 0/10 Worst Pain: 6/10, on average has been around 4 PAIN:  Are you having pain? 6/10 today, he tweaked his knee getting off the couch today.  PRECAUTIONS: WBAT, locked brace at all times when walking.  WEIGHT BEARING RESTRICTIONS: WBAT  FALLS:  Has patient fallen in last 6 months? No  LIVING ENVIRONMENT: Lives with: Wife Lives in: house  Stairs: 17 stairs to 2nd floor bedroom, no entry stairs  Has following equipment at home: crutches up to 6'6.   OCCUPATION: Retired   PLOF: Coaches basketball   PATIENT GOALS: Full recovery   NEXT MD VISIT:  June 2025   OBJECTIVE:  Note: Objective measures were completed at Evaluation unless otherwise noted.  PATIENT SURVEYS:  LEFS: 20% 04/17/24: 78% (63/80) EDEMA:  Unable to see, still ace wrapped  LOWER EXTREMITY ROM:  A/PROM Right eval Left eval Right   04/23/24  Right  04/28/24 Right  04/30/24 Right  05/06/24 Right  05/13/24   Hip flexion         Hip extension         Hip abduction         Hip adduction         Hip internal rotation         Hip external rotation         Knee flexion 65 degrees 125 deg  115/NT  117/NT  118/NT 123/NT 125 deg   Knee extension  5 degrees 0 deg    0/NT  0  deg   Knee internal rotation         Knee external rotation         Knee abduction         Knee adduction         Ankle dorsiflexion         Ankle plantarflexion         Ankle inversion         Ankle eversion          (Blank rows = not tested)      MMT   Right 05/13/2022 Left 05/13/2022  Hip flexion 4 4+  Hip extension    Hip abduction 4 4+  Hip adduction    Hip internal rotation    Hip external rotation    Knee flexion 4+ 4  Knee extension 4+ 4  Ankle dorsiflexion    Ankle plantarflexion    Ankle inversion    Ankle eversion     (Blank rows = not tested)     TODAY'S TREATMENT   06/16/24: THERAC Nu-Step with seat and arms at 11 with resistance 2 for 6 min   Squats with butt taps on  25 inch mat surface 1 x 10   Kick Stand RDLs on RLE on #20 KB 2 x 10   -min VC to widen base of support   Lateral Side Step lead with RLE leading 30 ft x 4    Standing Heel Raises with BUE support 2 x 15   Standing Heel Raises with #10 DB in each hand 2 x 15     Standing Step Up on RLE with 6 inch step  2 x 10      05/27/24 THEREX    Nu-Step with seat and arms at 11 with resistance 2 for 6 min   Knee Flex AROM R/L 120/123   Single Leg Sit to Stand on RLE from 28 2 x 10    Front Step Down on 6 inch step with intermittent UE support 2 x 10   -Pt shows improved eccentric control    Sled Push Forward with #35- 30 ft x 10   Sled Push Backward with #45 x 2-  30 ft x 10   Double limb jump  2 x 10  -Limited clearance from ground    OMEGA Calf Raise with extended knee on RLE #45 1 x 10   OMEGA Calf Raise with extended knee on RLE #55 1 x 10   OMEGA Calf Raise with extended knee on RLE #65 1 x 10   OMEGA  Single Leg  Press  #45 on RLE 1 x 10    OMEGA  Single Leg Press #55 on RLE 1 x 10   OMEGA Single Leg Press #65 on RLE 1 x 10  -min VC to decrease speed of eccentric phase      PATIENT EDUCATION:  Education details: Tissue healing timeline and explanation of car seat position and  LE dermatomes  Person educated: Patient and spouse  Education method: collaborative learning, deliberate practice, positive reinforcement, explicit instruction, establish rules. Education comprehension: excellent  HOME EXERCISE PROGRAM: Access Code: [facility] URL: [email or web address] Date: 06/16/2024 Prepared by: Toribio Servant  Exercises - Seated Sciatic Nerve Glide With Cervical Motion  - 1 x daily - 7 x weekly - 1 sets - 10 reps - Seated Knee Flexion at Wall  - 1 x daily - 7 x weekly - 1 sets - 10 reps - 30 sec  hold - Modified Thomas Stretch  - 1 x daily - 7 x weekly - 3 reps - 30- 60 sec hold - Prone Quadriceps Stretch with Strap  - 1 x daily - 7 x weekly - 3 reps - 60 sec  hold - Seated Hip External Rotation Stretch  - 1 x daily - 7 x weekly - 3 reps - 60 sec  hold - Sidestepping  - 3-4 x weekly - 3 sets - 10 reps - Single-Leg [place] Deadlift With Dumbbell  - 3-4 x weekly - 3 sets - 10 reps - Standing Heel Raise  - 3-4 x weekly - 3 sets - 10 reps - Step Up (Mirrored)  - 3-4 x weekly - 3 sets - 10 reps  ASSESSMENT:  CLINICAL IMPRESSION:   Pt is now s/p 15 weeks lateral meniscus repair of right knee. He is returning after prolonged absence due to falling on his right knee. Despite the fall, he was able to perform many of the right hip and knee strengthening exercises without increasing his pain. PT modified home exercise plan to exclude single leg eccentric exercise for now to avoid provoking pain, but these will be re-introduced next session.  He will continue to benefit from skilled PT to improve right knee ROM and strength and to return to athletic activity without pain and discomfort to maintain his quality of life and physical fitness.       OBJECTIVE IMPAIRMENTS:  Decreased knowledge of condition, decreased use of DME, decreased mobility, difficulty walking, decreased strength, decreased ROM. ACTIVITY LIMITATIONS: Lifting, standing, walking, squatting,  transfers, locomotion level PARTICIPATION LIMITATIONS: Cleaning, laundry, interpersonal relationships, driving, yardwork, community activity.  PERSONAL FACTORS: Age, behavior pattern, education, past/current experiences, transportation, profession  are also affecting patient's functional outcome.  REHAB POTENTIAL: Great  CLINICAL DECISION MAKING: Great EVALUATION COMPLEXITY: Low  GOALS: Goals reviewed with patient? yes  SHORT TERM GOALS: Target date: 04/05/24 Patient will report comprehension, confidence, and consistent compliance and of a simple home exercise program established to facilitate symptoms management and basic strengthening and/or segment mobility.  Baseline: issued at eval 03/12/24: Able to perform HEP independently  Goal status: ACHIEVED    2.  Patient to demonstrate improved score on self-report measure by >9% to indicate reduced self-reported disability and/or pain.    Baseline: LEFS: 20%   04/17/24: 78% Goal status: ACHIEVED  3.  Pt to demonstrate Rt knee RM 0-90 degrees  Baseline: eval: 5-65 deg 03/20/24: 0-83  04/02/24: 0-90 deg on RLE  Goal status: ACHIEVED   LONG TERM GOALS: Target date: 08/22/24  Pt to demonstrate ability to  perform >1541ft without pain limitation, less than 2/10 increase in NPRS to improve ability to participate in IADL and social events.  Baseline: 1,200 ft 04/30/24: 1650 ft   Goal status: ACHIEVED     2.  Pt to demonstrate less than 10% strength deficit in between bilat knee extension and bilat ankle PF to prepare for return to leisure and community activity.  Baseline: Leg Press 1 RM R/L 80/160  Knee Ext 1 RM R/L  55/115  Single Leg Calf Raise R/L partial heel raise/ full heel raise on LLE 05/20/24:  Leg Press 1 RM R/L 105/160  Knee Ext 1 RM R/L  65/115  Goal status: ONGOING    3.  Patient to demonstrate improved performance on initial transfers assessment AEB improved reps per time or time per perform desired reps and/or reduced seat height  and/or use of hands in order to improve safety, tolerance, and independence in ADL performance.   Baseline: Goal not appropriate for measuring LE endurance for pt's age Goal status: Deferred   4.  Pt to demonstrate <10% difference between single leg hop bilaterally.  Baseline: Not able to perform due to restrictions  Goal status: ONGOING   PLAN:  PT FREQUENCY: 1-2x/week  PT DURATION: 12 weeks PLANNED INTERVENTIONS: 97110-Therapeutic exercises, 97530- Therapeutic activity, 97112- Neuromuscular re-education, [phone]- Self Care, 02859- Manual therapy, [phone]- Gait training, [phone]- Electrical stimulation (unattended), [phone]- Electrical stimulation (manual), Patient/Family education, Balance training, Stair training, Joint mobilization, Joint manipulation, Spinal manipulation, and Spinal mobilization  PLAN FOR NEXT SESSION: Re-introduce walking lunges and [place] split squat on RLE. Single Leg sit to stand.  Continue with use of OMEGA machine with HS curl, Leg Press and Knee Ext.    Toribio Servant PT, DPT  [facility] [address] [place], [address] Phone: [phone]   Fax:  [phone]

## 2024-06-18 ENCOUNTER — Ambulatory Visit: Admitting: Physical Therapy

## 2024-06-18 DIAGNOSIS — M25561 Pain in right knee: Secondary | ICD-10-CM

## 2024-06-18 DIAGNOSIS — R262 Difficulty in walking, not elsewhere classified: Secondary | ICD-10-CM

## 2024-06-18 DIAGNOSIS — M25661 Stiffness of right knee, not elsewhere classified: Secondary | ICD-10-CM

## 2024-06-18 NOTE — Therapy (Signed)
 OUTPATIENT PHYSICAL THERAPY TREATMENT    Patient Name: Gregory Banks. MRN: [id number] DOB:March 05, 1966, 58 y.o., male Today's Date: 06/18/2024  END OF SESSION:  PT End of Session - 06/18/24 1353     Visit Number 18    Number of Visits 24    Date for Recertification  08/22/24    Authorization Type Medicare primary; [employer] commercial 2ndary    Authorization - Visit Number 18    Authorization - Number of Visits 24    Progress Note Due on Visit 20    PT Start Time 1345    PT Stop Time 1430    PT Time Calculation (min) 45 min    Activity Tolerance Patient tolerated treatment well;No increased pain    Behavior During Therapy WFL for tasks assessed/performed              Past Medical History:  Diagnosis Date   Arthritis    knees, rt shoulder   Hyperlipidemia    Hypertension    MI (myocardial infarction) [facility])    Past Surgical History:  Procedure Laterality Date   CARDIAC CATHETERIZATION     Angioplasty with stent placement x 2   COLONOSCOPY WITH PROPOFOL  N/A 06/01/2017   Procedure: COLONOSCOPY WITH PROPOFOL ;  Surgeon: Therisa Bi, MD;  Location: [facility] ENDOSCOPY;  Service: Gastroenterology;  Laterality: N/A;   KNEE ARTHROSCOPY WITH MENISCAL REPAIR Right 02/26/2024   Procedure: ARTHROSCOPY, KNEE, WITH MENISCUS REPAIR;  Surgeon: Genelle Standing, MD;  Location: [facility];  Service: Orthopedics;  Laterality: Right;  RIGHT KNEE ARTHROSCOPY WITH LATERAL MENISCUS REPAIR   OTHER SURGICAL HISTORY     2 Cardiac stents   TUMOR REMOVAL     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   Patient Active Problem List   Diagnosis Date Noted   Complex tear of lateral meniscus of right knee as current injury 02/26/2024   Hyperlipidemia    MI (myocardial infarction) (HCC)    Intracranial mass 01/27/2015   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   Anxiety 03/04/2014   Chronic coronary artery disease 03/04/2014   Depression 03/04/2014   Hypertension 03/04/2014   Shortness of breath 03/04/2014   PCP:  Epifanio Alm SQUIBB, MD  REFERRING PROVIDER: [name] Genelle, MD (orthopedics)  REFERRING DIAG: s/p Rt latera meniscus repair  THERAPY DIAG:  Acute pain of right knee  Stiffness of right knee, not elsewhere classified  Difficulty in walking, not elsewhere classified  Rationale for Evaluation and Treatment: Rehabilitation  ONSET DATE: 02/26/24  SUBJECTIVE:   SUBJECTIVE STATEMENT:   Pt reports mild soreness in his hamstrings from doing the kick stand  RDLs, but other than that he has been doing great.    PERTINENT HISTORY: Knee pain and locking following a day of playing 4 basketball games, meniscus injury seen. Pt underwent lateral meniscus repair with Dr. Genelle on 6/3, placed in locked in extension, WBAT. At evaluation current pain: 6/10; Best pain: 0/10 Worst Pain: 6/10, on average has been around 4 PAIN:  Are you having pain? 6/10 today, he tweaked his knee getting off the couch today.  PRECAUTIONS: WBAT, locked brace at all times when walking.  WEIGHT BEARING RESTRICTIONS: WBAT  FALLS:  Has patient fallen in last 6 months? No  LIVING ENVIRONMENT: Lives with: Wife Lives in: house  Stairs: 17 stairs to 2nd floor bedroom, no entry stairs  Has following equipment at home: crutches up to 6'6.   OCCUPATION: Retired   PLOF: Coaches basketball   PATIENT GOALS: Full recovery   NEXT MD  VISIT: June 2025   OBJECTIVE:  Note: Objective measures were completed at Evaluation unless otherwise noted.  PATIENT SURVEYS:  LEFS: 20% 04/17/24: 78% (63/80) EDEMA:  Unable to see, still ace wrapped  LOWER EXTREMITY ROM:  A/PROM Right eval Left eval Right   04/23/24  Right  04/28/24 Right  04/30/24 Right  05/06/24 Right  05/13/24   Hip flexion         Hip extension         Hip abduction         Hip adduction         Hip internal rotation         Hip external rotation         Knee flexion 65 degrees 125 deg  115/NT  117/NT  118/NT 123/NT 125 deg   Knee extension  5 degrees 0 deg     0/NT  0 deg   Knee internal rotation         Knee external rotation         Knee abduction         Knee adduction         Ankle dorsiflexion         Ankle plantarflexion         Ankle inversion         Ankle eversion          (Blank rows = not tested)      MMT   Right 05/13/2022 Left 05/13/2022  Hip flexion 4 4+  Hip extension    Hip abduction 4 4+  Hip adduction    Hip internal rotation    Hip external rotation    Knee flexion 4+ 4  Knee extension 4+ 4  Ankle dorsiflexion    Ankle plantarflexion    Ankle inversion    Ankle eversion     (Blank rows = not tested)     TODAY'S TREATMENT   06/18/24: THERAC   Nu-Step with seat and arms at 14 with resistance 4  for 6 min  Engineer, manufacturing on RLE with BUE support 3 x 10   -min VC for setup using seated position and placing heel forward    Forward Step Down on RLE 6 inch step with 1 UE support  3 x 10    Lateral Step Down on RLE on 6 inch step with 1 UE support 3 x 10    NMR   BOSU Ball Static Stance 30 sec    BOSU Ball Quarter Squats 2 x 10    -min VC to increase thoracic extension and bring weight back onto heels     PATIENT EDUCATION:  Education details: Tissue healing timeline and explanation of car seat position and LE dermatomes  Person educated: Patient and spouse  Education method: Research scientist (medical), deliberate practice, positive reinforcement, explicit instruction, establish rules. Education comprehension: excellent  HOME EXERCISE PROGRAM: Access Code: [facility] URL: [email or web address] Date: 06/18/2024 Prepared by: Toribio Servant  Exercises - Seated Sciatic Nerve Glide With Cervical Motion  - 1 x daily - 7 x weekly - 1 sets - 10 reps - Seated Knee Flexion at Wall  - 1 x daily - 7 x weekly - 1 sets - 10 reps - 30 sec  hold - Modified Thomas Stretch  - 1 x daily - 7 x weekly - 3 reps - 30- 60 sec hold - Prone Quadriceps Stretch with Strap  - 1 x daily - 7 x weekly -  3 reps - 60  sec  hold - Seated Hip External Rotation Stretch  - 1 x daily - 7 x weekly - 3 reps - 60 sec  hold - Sidestepping  - 3-4 x weekly - 3 sets - 10 reps - Single-Leg [place] Deadlift With Dumbbell  - 3-4 x weekly - 3 sets - 10 reps - Standing Heel Raise  - 3-4 x weekly - 3 sets - 10 reps - [place] Split Squat  - 3-4 x weekly - 3 sets - 10 reps - Forward Step Down  - 3-4 x weekly - 3 sets - 10 reps - Lateral Step Down (Mirrored)  - 3-4 x weekly - 3 sets - 10 reps   ASSESSMENT:  CLINICAL IMPRESSION:   Pt is now s/p 15.5 weeks lateral meniscus repair of right knee. He is now able to perform all knee and hip strengthening exercises he performing before fall onto his right knee and without increase in his pain. PT to focus on power component of exercise during upcoming sessions to progress towards plyometrics. He will continue to benefit from skilled PT to improve right knee ROM and strength and to return to athletic activity without pain and discomfort to maintain his quality of life and physical fitness.      OBJECTIVE IMPAIRMENTS:  Decreased knowledge of condition, decreased use of DME, decreased mobility, difficulty walking, decreased strength, decreased ROM. ACTIVITY LIMITATIONS: Lifting, standing, walking, squatting, transfers, locomotion level PARTICIPATION LIMITATIONS: Cleaning, laundry, interpersonal relationships, driving, yardwork, community activity.  PERSONAL FACTORS: Age, behavior pattern, education, past/current experiences, transportation, profession  are also affecting patient's functional outcome.  REHAB POTENTIAL: Great  CLINICAL DECISION MAKING: Great EVALUATION COMPLEXITY: Low  GOALS: Goals reviewed with patient? yes  SHORT TERM GOALS: Target date: 04/05/24 Patient will report comprehension, confidence, and consistent compliance and of a simple home exercise program established to facilitate symptoms management and basic strengthening and/or segment mobility.  Baseline:  issued at eval 03/12/24: Able to perform HEP independently  Goal status: ACHIEVED    2.  Patient to demonstrate improved score on self-report measure by >9% to indicate reduced self-reported disability and/or pain.    Baseline: LEFS: 20%   04/17/24: 78% Goal status: ACHIEVED  3.  Pt to demonstrate Rt knee RM 0-90 degrees  Baseline: eval: 5-65 deg 03/20/24: 0-83  04/02/24: 0-90 deg on RLE  Goal status: ACHIEVED   LONG TERM GOALS: Target date: 08/22/24  Pt to demonstrate ability to perform >1525ft without pain limitation, less than 2/10 increase in NPRS to improve ability to participate in IADL and social events.  Baseline: 1,200 ft 04/30/24: 1650 ft   Goal status: ACHIEVED     2.  Pt to demonstrate less than 10% strength deficit in between bilat knee extension and bilat ankle PF to prepare for return to leisure and community activity.  Baseline: Leg Press 1 RM R/L 80/160  Knee Ext 1 RM R/L  55/115  Single Leg Calf Raise R/L partial heel raise/ full heel raise on LLE 05/20/24:  Leg Press 1 RM R/L 105/160  Knee Ext 1 RM R/L  65/115  Goal status: ONGOING    3.  Patient to demonstrate improved performance on initial transfers assessment AEB improved reps per time or time per perform desired reps and/or reduced seat height and/or use of hands in order to improve safety, tolerance, and independence in ADL performance.   Baseline: Goal not appropriate for measuring LE endurance for pt's age Goal status: Deferred  4.  Pt to demonstrate <10% difference between single leg hop bilaterally.  Baseline: Not able to perform due to restrictions  Goal status: ONGOING   PLAN:  PT FREQUENCY: 1-2x/week  PT DURATION: 12 weeks PLANNED INTERVENTIONS: 97110-Therapeutic exercises, 97530- Therapeutic activity, 97112- Neuromuscular re-education, 97535- Self Care, 02859- Manual therapy, [phone]- Gait training, [phone]- Electrical stimulation (unattended), [phone]- Electrical stimulation (manual), Patient/Family  education, Balance training, Stair training, Joint mobilization, Joint manipulation, Spinal manipulation, and Spinal mobilization  PLAN FOR NEXT SESSION: Reassess long term goals. Re-introduce walking lunges and introduce power component to exercises. Double Limb Jumps and continue to work on proprioception and balance     Toribio Servant PT, DPT  [facility] [address] [place], [address] Phone: [phone]   Fax:  [phone]

## 2024-06-23 ENCOUNTER — Ambulatory Visit: Admitting: Physical Therapy

## 2024-06-25 ENCOUNTER — Ambulatory Visit: Attending: Orthopaedic Surgery | Admitting: Physical Therapy

## 2024-06-25 ENCOUNTER — Encounter: Payer: Self-pay | Admitting: Physical Therapy

## 2024-06-25 DIAGNOSIS — M25661 Stiffness of right knee, not elsewhere classified: Secondary | ICD-10-CM | POA: Insufficient documentation

## 2024-06-25 DIAGNOSIS — M25561 Pain in right knee: Secondary | ICD-10-CM | POA: Insufficient documentation

## 2024-06-25 DIAGNOSIS — R262 Difficulty in walking, not elsewhere classified: Secondary | ICD-10-CM | POA: Diagnosis present

## 2024-06-25 NOTE — Therapy (Addendum)
 OUTPATIENT PHYSICAL THERAPY TREATMENT    Patient Name: Gregory Banks. 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    Authorization - Visit Number 19    Authorization - Number of Visits 24    Progress Note Due on Visit 20    PT Start Time 1300    PT Stop Time 1345    PT Time Calculation (min) 45 min    Activity Tolerance Patient tolerated treatment well;No increased pain    Behavior During Therapy WFL for tasks assessed/performed               Past Medical History:  Diagnosis Date   Arthritis    knees, rt shoulder   Hyperlipidemia    Hypertension    MI (myocardial infarction) [facility])    Past Surgical History:  Procedure Laterality Date   CARDIAC CATHETERIZATION     Angioplasty with stent placement x 2   COLONOSCOPY WITH PROPOFOL  N/A 06/01/2017   Procedure: COLONOSCOPY WITH PROPOFOL ;  Surgeon: Therisa Bi, MD;  Location: [facility]COPY;  Service: Gastroenterology;  Laterality: N/A;   KNEE ARTHROSCOPY WITH MENISCAL REPAIR Right 02/26/2024   Procedure: ARTHROSCOPY, KNEE, WITH MENISCUS REPAIR;  Surgeon: Genelle Standing, MD;  Location: [facility];  Service: Orthopedics;  Laterality: Right;  RIGHT KNEE ARTHROSCOPY WITH LATERAL MENISCUS REPAIR   OTHER SURGICAL HISTORY     2 Cardiac stents   TUMOR REMOVAL     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   Patient Active Problem List   Diagnosis Date Noted   Complex tear of lateral meniscus of right knee as current injury 02/26/2024   Hyperlipidemia    MI (myocardial infarction) (HCC)    Intracranial mass 01/27/2015   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   Anxiety 03/04/2014   Chronic coronary artery disease 03/04/2014   Depression 03/04/2014   Hypertension 03/04/2014   Shortness of breath 03/04/2014   PCP:  Epifanio Alm SQUIBB, MD  REFERRING PROVIDER: [name] Genelle, MD (orthopedics)  REFERRING DIAG: s/p Rt latera meniscus repair  THERAPY DIAG:  Acute pain of right knee  Stiffness of right knee, not elsewhere classified  Difficulty in walking, not elsewhere classified  Rationale for Evaluation and Treatment: Rehabilitation  ONSET DATE: 02/26/24  SUBJECTIVE:   SUBJECTIVE STATEMENT:   Pt felts some muscle soreness after last session, but that has since subsided.    PERTINENT HISTORY: Knee pain and locking following a day of playing 4 basketball games, meniscus injury seen. Pt underwent lateral meniscus repair with Dr. Genelle on 6/3, placed in locked in extension, WBAT. At evaluation current pain: 6/10; Best pain: 0/10 Worst Pain: 6/10, on average has been around 4 PAIN:  Are you having pain? 6/10 today, he tweaked his knee getting off the couch today.  PRECAUTIONS: WBAT, locked brace at all times when walking.  WEIGHT BEARING RESTRICTIONS: WBAT  FALLS:  Has patient fallen in last 6 months? No  LIVING ENVIRONMENT: Lives with: Wife Lives in: house  Stairs: 17 stairs to 2nd floor bedroom, no entry stairs  Has following equipment at home: crutches up to 6'6.   OCCUPATION: Retired   PLOF: Coaches basketball   PATIENT GOALS: Full recovery   NEXT MD VISIT: June 2025   OBJECTIVE:  Note: Objective  measures were completed at Evaluation unless otherwise noted.  PATIENT SURVEYS:  LEFS: 20% 04/17/24: 78% (63/80) EDEMA:  Unable to see, still ace wrapped  LOWER EXTREMITY ROM:  A/PROM Right eval Left eval Right   04/23/24  Right  04/28/24 Right  04/30/24 Right  05/06/24 Right  05/13/24   Hip flexion         Hip extension         Hip abduction         Hip adduction         Hip internal rotation         Hip external rotation         Knee flexion 65 degrees 125 deg  115/NT  117/NT  118/NT 123/NT 125 deg   Knee extension  5 degrees 0 deg    0/NT  0 deg   Knee internal rotation          Knee external rotation         Knee abduction         Knee adduction         Ankle dorsiflexion         Ankle plantarflexion         Ankle inversion         Ankle eversion          (Blank rows = not tested)      MMT   Right 05/13/2022 Left 05/13/2022  Hip flexion 4 4+  Hip extension    Hip abduction 4 4+  Hip adduction    Hip internal rotation    Hip external rotation    Knee flexion 4+ 4  Knee extension 4+ 4  Ankle dorsiflexion    Ankle plantarflexion    Ankle inversion    Ankle eversion     (Blank rows = not tested)     TODAY'S TREATMENT   10/1//25  THERAC   TM at 2.0 mph for 6 min   -min VC to increased stride length Squat Jumps  1 x 10    -min VC to decrease eccentric portion of exercise.      [place] split squat with power component with BUE support  3 x 10  -min VC for fast concentric phase or push up   [place] split squat with 1 UE support 1 x 10   -min VC to maintain upright trunk posture    Forward  Step Down on RLE on 8 inch step with 1 UE support  1 x 10        PATIENT EDUCATION:  Education details: Tissue healing timeline and explanation of car seat position and LE dermatomes  Person educated: Patient and spouse  Education method: collaborative learning, deliberate practice, positive reinforcement, explicit instruction, establish rules. Education comprehension: excellent  HOME EXERCISE PROGRAM: Do not print   Access Code: [facility] URL: [email or web address] Date: 06/25/2024 Prepared by: Toribio Servant  Exercises - Prone Quadriceps Stretch with Strap  - 1 x daily - 7 x weekly - 3 reps - 60 sec  hold - Seated Hip External Rotation Stretch  - 1 x daily - 7 x weekly - 3 reps - 60 sec  hold - Standing Heel Raise  - 3-4 x weekly - 3 sets - 10 reps - [place] Split Squat  - 3-4 x weekly - 3 sets - 10 reps - Forward Step Down  - 3-4 x weekly - 3 sets - 10 reps - Lateral Step Down (Mirrored)  -  3-4 x weekly - 3 sets - 10  reps - Squat Jumps  - 5-7 x weekly - 2 sets - 10 reps   ASSESSMENT:  CLINICAL IMPRESSION:   Pt is now s/p 16 weeks lateral meniscus repair of right knee. He shows improvement in RLE strength and power with ability to perform bulgar[place] split squats with fast concentric phase. He still requires UE support for eccentric portion of forward step down. PT focusing quad and glute exercises like squat jumps, Blugarian split squats, and  forward step downs to improve power and prioprioception to return to jumping and running to return to playing basketball to remain in shape.       OBJECTIVE IMPAIRMENTS:  Decreased knowledge of condition, decreased use of DME, decreased mobility, difficulty walking, decreased strength, decreased ROM. ACTIVITY LIMITATIONS: Lifting, standing, walking, squatting, transfers, locomotion level PARTICIPATION LIMITATIONS: Cleaning, laundry, interpersonal relationships, driving, yardwork, community activity.  PERSONAL FACTORS: Age, behavior pattern, education, past/current experiences, transportation, profession  are also affecting patient's functional outcome.  REHAB POTENTIAL: Great  CLINICAL DECISION MAKING: Great EVALUATION COMPLEXITY: Low  GOALS: Goals reviewed with patient? yes  SHORT TERM GOALS: Target date: 04/05/24 Patient will report comprehension, confidence, and consistent compliance and of a simple home exercise program established to facilitate symptoms management and basic strengthening and/or segment mobility.  Baseline: issued at eval 03/12/24: Able to perform HEP independently  Goal status: ACHIEVED    2.  Patient to demonstrate improved score on self-report measure by >9% to indicate reduced self-reported disability and/or pain.    Baseline: LEFS: 20%   04/17/24: 78% Goal status: ACHIEVED  3.  Pt to demonstrate Rt knee RM 0-90 degrees  Baseline: eval: 5-65 deg 03/20/24: 0-83  04/02/24: 0-90 deg on RLE  Goal status: ACHIEVED   LONG TERM GOALS: Target  date: 08/22/24  Pt to demonstrate ability to perform >1571ft without pain limitation, less than 2/10 increase in NPRS to improve ability to participate in IADL and social events.  Baseline: 1,200 ft 04/30/24: 1650 ft   Goal status: ACHIEVED     2.  Pt to demonstrate less than 10% strength deficit in between bilat knee extension and bilat ankle PF to prepare for return to leisure and community activity.  Baseline: Leg Press 1 RM R/L 80/160  Knee Ext 1 RM R/L  55/115  Single Leg Calf Raise R/L partial heel raise/ full heel raise on LLE 05/20/24:  Leg Press 1 RM R/L 105/160  Knee Ext 1 RM R/L  65/115  Goal status: ONGOING    3.  Patient to demonstrate improved performance on initial transfers assessment AEB improved reps per time or time per perform desired reps and/or reduced seat height and/or use of hands in order to improve safety, tolerance, and independence in ADL performance.   Baseline: Goal not appropriate for measuring LE endurance for pt's age Goal status: Deferred   4.  Pt to demonstrate <10% difference between single leg hop bilaterally 06/25/24: Only perform double limb jumps. Baseline: Not able to perform due to restrictions  Goal status: ONGOING   PLAN:  PT FREQUENCY: 1-2x/week  PT DURATION: 12 weeks PLANNED INTERVENTIONS: 97110-Therapeutic exercises, 97530- Therapeutic activity, 97112- Neuromuscular re-education, [phone]- Self Care, 02859- Manual therapy, [phone]- Gait training, [phone]- Electrical stimulation (unattended), [phone]- Electrical stimulation (manual), Patient/Family education, Balance training, Stair training, Joint mobilization, Joint manipulation, Spinal manipulation, and Spinal mobilization  PLAN FOR NEXT SESSION: Progress note-Reassess long term goals: 1 RM max using machines and measure single leg  hop. Re-introduce walking lunges and mix in some single leg balance exercises using blaze pods.     Toribio Servant PT, DPT  [facility] [address] [place], [address] Phone: [phone]   Fax:  [phone]

## 2024-06-26 ENCOUNTER — Ambulatory Visit: Admitting: Physical Therapy

## 2024-06-26 ENCOUNTER — Encounter: Payer: Self-pay | Admitting: Physical Therapy

## 2024-06-26 DIAGNOSIS — M25561 Pain in right knee: Secondary | ICD-10-CM

## 2024-06-26 DIAGNOSIS — M25661 Stiffness of right knee, not elsewhere classified: Secondary | ICD-10-CM

## 2024-06-26 DIAGNOSIS — R262 Difficulty in walking, not elsewhere classified: Secondary | ICD-10-CM

## 2024-06-26 NOTE — Therapy (Signed)
 OUTPATIENT PHYSICAL THERAPY TREATMENT / PROGRESS NOTE Dates of reporting from 04/30/2024 to 06/26/2024    Patient Name: Gregory Banks. MRN: [id number] DOB:[birth year], 58 y.o., male Today's Date: 06/26/2024  END OF SESSION:  PT End of Session - 06/26/24 1746     Visit Number 20    Number of Visits 24    Date for Recertification  08/22/24    Authorization Type Medicare primary; eneric commercial 2ndary    Authorization - Visit Number 20    Authorization - Number of Visits 24    Progress Note Due on Visit 20    PT Start Time 1650    PT Stop Time 1734    PT Time Calculation (min) 44 min    Activity Tolerance Patient tolerated treatment well;No increased pain    Behavior During Therapy WFL for tasks assessed/performed            Past Medical History:  Diagnosis Date   Arthritis    knees, rt shoulder   Hyperlipidemia    Hypertension    MI (myocardial infarction) [facility])    Past Surgical History:  Procedure Laterality Date   CARDIAC CATHETERIZATION     Angioplasty with stent placement x 2   COLONOSCOPY WITH PROPOFOL  N/A 06/01/2017   Procedure: COLONOSCOPY WITH PROPOFOL ;  Surgeon: Therisa Bi, MD;  Location: [facility] ENDOSCOPY;  Service: Gastroenterology;  Laterality: N/A;   KNEE ARTHROSCOPY WITH MENISCAL REPAIR Right 02/26/2024   Procedure: ARTHROSCOPY, KNEE, WITH MENISCUS REPAIR;  Surgeon: Genelle Standing, MD;  Location: [facility];  Service: Orthopedics;  Laterality: Right;  RIGHT KNEE ARTHROSCOPY WITH LATERAL MENISCUS REPAIR   OTHER SURGICAL HISTORY     2 Cardiac stents   TUMOR REMOVAL     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   Patient Active Problem List   Diagnosis Date Noted   Complex tear of lateral meniscus of right knee as current injury 02/26/2024   Hyperlipidemia    MI (myocardial infarction) (HCC)    Intracranial mass 01/27/2015   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   Anxiety 03/04/2014   Chronic coronary artery disease 03/04/2014   Depression 03/04/2014   Hypertension  03/04/2014   Shortness of breath 03/04/2014   PCP: Epifanio Alm SQUIBB, MD  REFERRING PROVIDER: [name] Genelle, MD (orthopedics)  REFERRING DIAG: s/p Rt latera meniscus repair  THERAPY DIAG:  Acute pain of right knee  Stiffness of right knee, not elsewhere classified  Difficulty in walking, not elsewhere classified  Rationale for Evaluation and Treatment: Rehabilitation  ONSET DATE: 02/26/24  SUBJECTIVE:   SUBJECTIVE STATEMENT:   Patient states he has some hamstring and glute soreness following PT yesterday but no knee pain.   PERTINENT HISTORY: Knee pain and locking following a day of playing 4 basketball games, meniscus injury seen. Pt underwent lateral meniscus repair with Dr. Genelle on 6/3, placed in locked in extension, WBAT. At evaluation current pain: 6/10; Best pain: 0/10 Worst Pain: 6/10, on average has been around 4 PAIN:  Are you having pain? 0/10   PRECAUTIONS: WBAT, locked brace at all times when walking.  WEIGHT BEARING RESTRICTIONS: WBAT  FALLS:  Has patient fallen in last 6 months? No  LIVING ENVIRONMENT: Lives with: Wife Lives in: house  Stairs: 17 stairs to 2nd floor bedroom, no entry stairs  Has following equipment at home: crutches up to 6'6.   OCCUPATION: Retired   PLOF: Coaches basketball   PATIENT GOALS: Full recovery   NEXT MD VISIT: June 2025   OBJECTIVE:  Note: Objective  measures were completed at Evaluation unless otherwise noted.  PATIENT SURVEYS:  LEFS: 20% 04/17/24: 78% (63/80). 06/26/24: 84% (67/80) EDEMA:  Unable to see, still ace wrapped  LOWER EXTREMITY ROM:  A/PROM Right eval Left eval Right   04/23/24  Right  04/28/24 Right  04/30/24 Right  05/06/24 Right  05/13/24   Hip flexion         Hip extension         Hip abduction         Hip adduction         Hip internal rotation         Hip external rotation         Knee flexion 65 degrees 125 deg  115/NT  117/NT  118/NT 123/NT 125 deg   Knee extension  5 degrees 0 deg    0/NT   0 deg   Knee internal rotation         Knee external rotation         Knee abduction         Knee adduction         Ankle dorsiflexion         Ankle plantarflexion         Ankle inversion         Ankle eversion          (Blank rows = not tested)      MMT   Right 05/13/2022 Left 05/13/2022  Hip flexion 4 4+  Hip extension    Hip abduction 4 4+  Hip adduction    Hip internal rotation    Hip external rotation    Knee flexion 4+ 4  Knee extension 4+ 4  Ankle dorsiflexion    Ankle plantarflexion    Ankle inversion    Ankle eversion     (Blank rows = not tested)    1RM TESTING (calculated from <4RM, 06/26/2024) Single leg press at [facility], seat position 4 (meant to be 5 but missed) with toe at top of plate:  R: 873.4# (calculated from 3RM 115#) L: 144# (calculated from 2RM 135#) LSI: 87.8%  Single knee extension at [facility] machine, seat position 5 R: 90.7# (calculated from 2RM 85#) L: 115# (actual 1RM) LSI: 78.9%   TODAY'S TREATMENT    06/26/24  Therapeutic activities: dynamic therapeutic activities incorporating MULTIPLE parameters or areas of the body designed to achieve improved functional performance.  Treadmill ambulation. For improved upright mobility, muscular endurance, and activity tolerance; and to induce the analgesic effect of aerobic exercise, stimulate improved joint nutrition, and prepare body structures and systems for following interventions. Also to reinforce understanding of appropriate exercise intensity to help meet physical activity guidelines for health.  Speed: Incline: 0% UE support: none 5  minutes RPE: 5/10 Tripped once but recovered (scuffed foot)  Leg press at [facility], seat position 4 (meant to be 5 but missed) with toe at top of plate: Double leg:  1x10 at 105# Single leg:   R:  1x10 at 105#,    1x3 at 115# (max)   L:  Attemped at 165# and 160# (unable) 1x2 at 135# (max)  Straight leg Heel raise at [facility] machine (leg  press) Double leg (at seat position 4):    1x10 at 115# (incomplete ROM because appears to be hitting end of ROM allowed by machine) Single leg (at seat position 5) Tried a couple on both sides at a heavy weight, noted for incomplete ROM, difficulty assessing, discontinued.  Squat jumps (focus on landing in squat instead of with stiff knees, throwing arms overhead during jump):  7x5 to decrease fatigue and allow more focus on form Pt improving form with cuing  Therapeutic exercise: therapeutic exercises that incorporate ONE parameter at one or more areas of the body to centralize symptoms, develop strength and endurance, range of motion, and flexibility required for successful completion of functional activities.  Single knee extension at [facility] machine, seat position 5 L:  1x115# (max) R:  1x1 at 85#  1.5 min rest  1x2 at 85# (max)  Pt required multimodal cuing for proper technique and to facilitate improved neuromuscular control, strength, range of motion, and functional ability resulting in improved performance and form.   10/1//25  THERAC   TM at 2.0 mph for 6 min   -min VC to increased stride length Squat Jumps  1 x 10    -min VC to decrease eccentric portion of exercise.      [place] split squat with power component with BUE support  3 x 10  -min VC for fast concentric phase or push up   [place] split squat with 1 UE support 1 x 10   -min VC to maintain upright trunk posture    Forward  Step Down on RLE on 8 inch step with 1 UE support  1 x 10       PATIENT EDUCATION:  Education details: Tissue healing timeline and explanation of car seat position and LE dermatomes  Person educated: Patient and spouse  Education method: collaborative learning, deliberate practice, positive reinforcement, explicit instruction, establish rules. Education comprehension: excellent  HOME EXERCISE PROGRAM: Do not print   Access Code: [facility] URL:  [email or web address] Date: 06/25/2024 Prepared by: Toribio Servant  Exercises - Prone Quadriceps Stretch with Strap  - 1 x daily - 7 x weekly - 3 reps - 60 sec  hold - Seated Hip External Rotation Stretch  - 1 x daily - 7 x weekly - 3 reps - 60 sec  hold - Standing Heel Raise  - 3-4 x weekly - 3 sets - 10 reps - [place] Split Squat  - 3-4 x weekly - 3 sets - 10 reps - Forward Step Down  - 3-4 x weekly - 3 sets - 10 reps - Lateral Step Down (Mirrored)  - 3-4 x weekly - 3 sets - 10 reps - Squat Jumps  - 5-7 x weekly - 2 sets - 10 reps   ASSESSMENT:  CLINICAL IMPRESSION:   Patient has completed 20 physical therapy sessions since starting current episode of care on 03/06/2024. He is now 17 weeks, 2 days s/p R lateral meniscus repair of right knee. He returned to PT yesterday after taking 2 weeks off due to irritaion from a fall. He tolerated yesterday's visit well and was able to complete 1RM testing at the knee today and continue with squat jumps. He has met 3/3 short term goals and 1/3 long term goals. He is making good progress towards the remaining 2 long term goals. His L quad strength is improving with 1RM for leg press increasing from 105 to 126.5 and R knee extension from 65 to 90.7 since 05/20/2024. He did not complete single leg hopping today, but is performing double leg squat jumps with good tolerance. Patient is  Demonstrating ability to perform [place] split squats with fast concentric phase. He still requires UE support for eccentric portion of forward step down. Patient does have difficulty with not landing with stiff  legs during squat jumps, but demonstrated improvement with cuing. He would benefit from continued PT improve power and prioprioception to return to jumping and running to return to playing basketball to remain in shape. Patient would benefit from continued management of limiting condition by skilled physical therapist to address remaining impairments and  functional limitations to work towards stated goals and return to PLOF or maximal functional independence.    OBJECTIVE IMPAIRMENTS:  Decreased knowledge of condition, decreased use of DME, decreased mobility, difficulty walking, decreased strength, decreased ROM. ACTIVITY LIMITATIONS: Lifting, standing, walking, squatting, transfers, locomotion level PARTICIPATION LIMITATIONS: Cleaning, laundry, interpersonal relationships, driving, yardwork, community activity.  PERSONAL FACTORS: Age, behavior pattern, education, past/current experiences, transportation, profession  are also affecting patient's functional outcome.  REHAB POTENTIAL: Great  CLINICAL DECISION MAKING: Great EVALUATION COMPLEXITY: Low  GOALS: Goals reviewed with patient? yes  SHORT TERM GOALS: Target date: 04/05/24 Patient will report comprehension, confidence, and consistent compliance and of a simple home exercise program established to facilitate symptoms management and basic strengthening and/or segment mobility.  Baseline: issued at eval 03/12/24: Able to perform HEP independently  Goal status: ACHIEVED    2.  Patient to demonstrate improved score on self-report measure by >9% to indicate reduced self-reported disability and/or pain.    Baseline: LEFS: 20%   04/17/24: 78% Goal status: ACHIEVED  3.  Pt to demonstrate Rt knee RM 0-90 degrees  Baseline: eval: 5-65 deg 03/20/24: 0-83  04/02/24: 0-90 deg on RLE  Goal status: ACHIEVED   LONG TERM GOALS: Target date: 08/22/24  Pt to demonstrate ability to perform >1599ft without pain limitation, less than 2/10 increase in NPRS to improve ability to participate in IADL and social events.  Baseline: 1,200 ft 04/30/24: 1650 ft   Goal status: ACHIEVED     2.  Pt to demonstrate less than 10% strength deficit in between bilat knee extension and bilat ankle PF to prepare for return to leisure and community activity.  Baseline: Leg Press 1 RM R/L 80/160  Knee Ext 1 RM R/L   55/115  Single Leg Calf Raise R/L partial heel raise/ full heel raise on LLE 05/20/24:  Leg Press 1 RM R/L 105/160  Knee Ext 1 RM R/L  65/115. 06/26/24: Leg press calculated 1RM  R/L 126.5/144, Knee Ext calculated 1RM R/L 90.7/115, ankle PF testing deferred.  Goal status: IN PROGRESS  3.  Patient to demonstrate improved performance on initial transfers assessment AEB improved reps per time or time per perform desired reps and/or reduced seat height and/or use of hands in order to improve safety, tolerance, and independence in ADL performance.   Baseline: Goal not appropriate for measuring LE endurance for pt's age Goal status: discontinued  4.  Pt to demonstrate <10% difference between single leg hop bilaterally  Baseline: Not able to perform due to restrictions. 06/25/24 & 06/26/24: Only performed double limb jumps. Goal status: ONGOING   PLAN:  PT FREQUENCY: 1-2x/week  PT DURATION: 12 weeks PLANNED INTERVENTIONS: 97110-Therapeutic exercises, 97530- Therapeutic activity, 97112- Neuromuscular re-education, [phone]- Self Care, 02859- Manual therapy, [phone]- Gait training, [phone]- Electrical stimulation (unattended), [phone]- Electrical stimulation (manual), Patient/Family education, Balance training, Stair training, Joint mobilization, Joint manipulation, Spinal manipulation, and Spinal mobilization  PLAN FOR NEXT SESSION: measure single leg hop as appropriate. Re-introduce walking lunges and mix in some single leg balance exercises using blaze pods.  Continue working on LE strength and power.    Camie SAUNDERS. Juli, PT, DPT, Cert. MDT 06/26/24, 8:01 PM  [facility] [address] [place], [place] [address] P: [phone] I F: [phone]

## 2024-06-30 ENCOUNTER — Ambulatory Visit: Admitting: Physical Therapy

## 2024-06-30 DIAGNOSIS — M25561 Pain in right knee: Secondary | ICD-10-CM

## 2024-06-30 DIAGNOSIS — M25661 Stiffness of right knee, not elsewhere classified: Secondary | ICD-10-CM

## 2024-06-30 DIAGNOSIS — R262 Difficulty in walking, not elsewhere classified: Secondary | ICD-10-CM

## 2024-06-30 NOTE — Therapy (Addendum)
 OUTPATIENT PHYSICAL THERAPY TREATMENT     Patient Name: Gregory Banks. MRN: [id number] DOB:1966-02-07, 58 y.o., male Today's Date: 06/30/2024  END OF SESSION:  PT End of Session - 06/30/24 1354     Visit Number 21    Number of Visits 24    Date for Recertification  08/22/24    Authorization Type Medicare primary; eneric commercial 2ndary    Authorization - Visit Number 21    Authorization - Number of Visits 24    Progress Note Due on Visit 24    PT Start Time 1345    PT Stop Time 1430    PT Time Calculation (min) 45 min    Activity Tolerance Patient tolerated treatment well;No increased pain    Behavior During Therapy WFL for tasks assessed/performed            Past Medical History:  Diagnosis Date   Arthritis    knees, rt shoulder   Hyperlipidemia    Hypertension    MI (myocardial infarction) [facility])    Past Surgical History:  Procedure Laterality Date   CARDIAC CATHETERIZATION     Angioplasty with stent placement x 2   COLONOSCOPY WITH PROPOFOL  N/A 06/01/2017   Procedure: COLONOSCOPY WITH PROPOFOL ;  Surgeon: Therisa Bi, MD;  Location: [facility] ENDOSCOPY;  Service: Gastroenterology;  Laterality: N/A;   KNEE ARTHROSCOPY WITH MENISCAL REPAIR Right 02/26/2024   Procedure: ARTHROSCOPY, KNEE, WITH MENISCUS REPAIR;  Surgeon: Genelle Standing, MD;  Location: [facility];  Service: Orthopedics;  Laterality: Right;  RIGHT KNEE ARTHROSCOPY WITH LATERAL MENISCUS REPAIR   OTHER SURGICAL HISTORY     2 Cardiac stents   TUMOR REMOVAL     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   Patient Active Problem List   Diagnosis Date Noted   Complex tear of lateral meniscus of right knee as current injury 02/26/2024   Hyperlipidemia    MI (myocardial infarction) (HCC)    Intracranial mass 01/27/2015   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   Anxiety 03/04/2014   Chronic coronary artery disease 03/04/2014   Depression 03/04/2014   Hypertension 03/04/2014   Shortness of breath 03/04/2014   PCP:  Epifanio Alm SQUIBB, MD  REFERRING PROVIDER: [name] Genelle, MD (orthopedics)  REFERRING DIAG: s/p Rt latera meniscus repair  THERAPY DIAG:  Acute pain of right knee  Stiffness of right knee, not elsewhere classified  Difficulty in walking, not elsewhere classified  Rationale for Evaluation and Treatment: Rehabilitation  ONSET DATE: 02/26/24  SUBJECTIVE:   SUBJECTIVE STATEMENT:   Pt states that he felt some soreness after last session, but he otherwise is doing well.    PERTINENT HISTORY: Knee pain and locking following a day of playing 4 basketball games, meniscus injury seen. Pt underwent lateral meniscus repair with Dr. Genelle on 6/3, placed in locked in extension, WBAT. At evaluation current pain: 6/10; Best pain: 0/10 Worst Pain: 6/10, on average has been around 4 PAIN:  Are you having pain? 0/10   PRECAUTIONS: WBAT, locked brace at all times when walking.  WEIGHT BEARING RESTRICTIONS: WBAT  FALLS:  Has patient fallen in last 6 months? No  LIVING ENVIRONMENT: Lives with: Wife Lives in: house  Stairs: 17 stairs to 2nd floor bedroom, no entry stairs  Has following equipment at home: crutches up to 6'6.   OCCUPATION: Retired   PLOF: Coaches basketball   PATIENT GOALS: Full recovery   NEXT MD VISIT: June 2025   OBJECTIVE:  Note: Objective measures were completed at Evaluation unless otherwise noted.  PATIENT SURVEYS:  LEFS: 20% 04/17/24: 78% (63/80). 06/26/24: 84% (67/80) EDEMA:  Unable to see, still ace wrapped  LOWER EXTREMITY ROM:  A/PROM Right eval Left eval Right   04/23/24  Right  04/28/24 Right  04/30/24 Right  05/06/24 Right  05/13/24   Hip flexion         Hip extension         Hip abduction         Hip adduction         Hip internal rotation         Hip external rotation         Knee flexion 65 degrees 125 deg  115/NT  117/NT  118/NT 123/NT 125 deg   Knee extension  5 degrees 0 deg    0/NT  0 deg   Knee internal rotation         Knee external  rotation         Knee abduction         Knee adduction         Ankle dorsiflexion         Ankle plantarflexion         Ankle inversion         Ankle eversion          (Blank rows = not tested)      MMT   Right 05/13/2022 Left 05/13/2022  Hip flexion 4 4+  Hip extension    Hip abduction 4 4+  Hip adduction    Hip internal rotation    Hip external rotation    Knee flexion 4+ 4  Knee extension 4+ 4  Ankle dorsiflexion    Ankle plantarflexion    Ankle inversion    Ankle eversion     (Blank rows = not tested)    1RM TESTING (calculated from <4RM, 06/26/2024) Single leg press at [facility], seat position 4 (meant to be 5 but missed) with toe at top of plate:  R: 873.4# (calculated from 3RM 115#) L: 144# (calculated from 2RM 135#) LSI: 87.8%  Single knee extension at [facility] machine, seat position 5 R: 90.7# (calculated from 2RM 85#) L: 115# (actual 1RM) LSI: 78.9%   TODAY'S TREATMENT   06/30/24:   THERAC  Nu-Step with seat at 15 for 5 min  Single Leg Hop on RLE 35 inch needs to land with two feet   RLE Single Leg Step Down  on 10 inch step with intermittent UE support x 20     Plyometric  -Double Limb Hop in Place x 10   -Double Limb Hop Forward and Back x 10   -Double Limb Hop Side to Side x 10     NMR: RLE single leg stance   Three cone tap clockwise with cones in front  1 x 10   Three cone tap  clockwise with cones in front on foam 1 x 10   Four cone tap  clockwise  with cones in front on foam 1 x 10      PATIENT EDUCATION:  Education details: Tissue healing timeline and explanation of car seat position and LE dermatomes  Person educated: Patient and spouse  Education method: collaborative learning, deliberate practice, positive reinforcement, explicit instruction, establish rules. Education comprehension: excellent  HOME EXERCISE PROGRAM: Do not print   Access Code: [facility] URL: [email or web address] Date: 06/30/2024 Prepared by: Toribio Servant  Program Notes Scissor Jumps 7 days per week -1x per day- 20  Exercises - Prone Quadriceps Stretch with Strap  - 1 x daily - 7 x weekly - 3 reps - 60 sec  hold - Seated Hip External Rotation Stretch  - 1 x daily - 7 x weekly - 3 reps - 60 sec  hold - Engineer, manufacturing  - 3-4 x weekly - 3 sets - 10 reps - Forward Step Down  - 3-4 x weekly - 3 sets - 10 reps - Jumping Rope  - 1 x daily - 7 x weekly - 20 reps - Sideways Tape Jumps  - 1 x daily - 7 x weekly - 20 reps - Forward Tape Jumps  - 1 x daily - 7 x weekly - 20 reps   ASSESSMENT:  CLINICAL IMPRESSION:   Pt shows significant improvement with right knee function with an ability to perform an increased volume of double limb hops and without experiencing pain. Pt still struggling to maintain right knee stability when performing a single leg hop as evidenced by inability to perform forward hop without landing on two feet. PT focused on retraining coordination of muscles in lower limb, ongoing strengthening of RLE, and training for endurance to improve ability to jump and land safely on right leg to return to running and jumping without re-injurying his right knee. He will continue to benefit from continued management of limiting condition by skilled physical therapist to address remaining impairments and functional limitations to work towards stated goals and return to PLOF or maximal functional independence.    OBJECTIVE IMPAIRMENTS:  Decreased knowledge of condition, decreased use of DME, decreased mobility, difficulty walking, decreased strength, decreased ROM. ACTIVITY LIMITATIONS: Lifting, standing, walking, squatting, transfers, locomotion level PARTICIPATION LIMITATIONS: Cleaning, laundry, interpersonal relationships, driving, yardwork, community activity.  PERSONAL FACTORS: Age, behavior pattern, education, past/current experiences, transportation, profession  are also affecting patient's functional outcome.  REHAB  POTENTIAL: Great  CLINICAL DECISION MAKING: Great EVALUATION COMPLEXITY: Low  GOALS: Goals reviewed with patient? yes  SHORT TERM GOALS: Target date: 04/05/24 Patient will report comprehension, confidence, and consistent compliance and of a simple home exercise program established to facilitate symptoms management and basic strengthening and/or segment mobility.  Baseline: issued at eval 03/12/24: Able to perform HEP independently  Goal status: ACHIEVED    2.  Patient to demonstrate improved score on self-report measure by >9% to indicate reduced self-reported disability and/or pain.    Baseline: LEFS: 20%   04/17/24: 78% Goal status: ACHIEVED  3.  Pt to demonstrate Rt knee RM 0-90 degrees  Baseline: eval: 5-65 deg 03/20/24: 0-83  04/02/24: 0-90 deg on RLE  Goal status: ACHIEVED   LONG TERM GOALS: Target date: 08/22/24  Pt to demonstrate ability to perform >1529ft without pain limitation, less than 2/10 increase in NPRS to improve ability to participate in IADL and social events.  Baseline: 1,200 ft 04/30/24: 1650 ft   Goal status: ACHIEVED     2.  Pt to demonstrate less than 10% strength deficit in between bilat knee extension and bilat ankle PF to prepare for return to leisure and community activity.  Baseline: Leg Press 1 RM R/L 80/160  Knee Ext 1 RM R/L  55/115  Single Leg Calf Raise R/L partial heel raise/ full heel raise on LLE 05/20/24:  Leg Press 1 RM R/L 105/160  Knee Ext 1 RM R/L  65/115. 06/26/24: Leg press calculated 1RM  R/L 126.5/144, Knee Ext calculated 1RM R/L 90.7/115, ankle PF testing deferred.  Goal status: IN PROGRESS  3.  Patient to demonstrate improved performance on initial transfers assessment AEB improved reps per time or time per perform desired reps and/or reduced seat height and/or use of hands in order to improve safety, tolerance, and independence in ADL performance.   Baseline: Goal not appropriate for measuring LE endurance for pt's age Goal status:  discontinued  4.  Pt to demonstrate <10% difference between single leg hop bilaterally  Baseline: Not able to perform due to restrictions. 06/30/24: Single Leg Hop R/L 35/NT    Goal status: ONGOING   PLAN:  PT FREQUENCY: 1-2x/week  PT DURATION: 12 weeks PLANNED INTERVENTIONS: 97110-Therapeutic exercises, 97530- Therapeutic activity, 97112- Neuromuscular re-education, 97535- Self Care, 02859- Manual therapy, [phone]- Gait training, [phone]- Electrical stimulation (unattended), [phone]- Electrical stimulation (manual), Patient/Family education, Balance training, Stair training, Joint mobilization, Joint manipulation, Spinal manipulation, and Spinal mobilization  PLAN FOR NEXT SESSION: Begin single leg hop progressions following [employer] return to running protocol. Perform single leg hop on LLE.  Re-introduce walking lunges and mix in some single leg balance exercises using blaze pods.  Continue working on LE strength and power.   Toribio Servant PT, DPT  [facility] [address], [place], [address] Phone: [phone]   Fax:  [phone]

## 2024-07-02 ENCOUNTER — Ambulatory Visit: Admitting: Physical Therapy

## 2024-07-02 DIAGNOSIS — M25661 Stiffness of right knee, not elsewhere classified: Secondary | ICD-10-CM

## 2024-07-02 DIAGNOSIS — M25561 Pain in right knee: Secondary | ICD-10-CM

## 2024-07-02 DIAGNOSIS — R262 Difficulty in walking, not elsewhere classified: Secondary | ICD-10-CM

## 2024-07-02 NOTE — Therapy (Signed)
 OUTPATIENT PHYSICAL THERAPY TREATMENT     Patient Name: Gregory Banks. 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    Authorization - Visit Number 22    Authorization - Number of Visits 24    Progress Note Due on Visit 24    PT Start Time 1300    PT Stop Time 1345    PT Time Calculation (min) 45 min    Activity Tolerance Patient tolerated treatment well;No increased pain    Behavior During Therapy WFL for tasks assessed/performed            Past Medical History:  Diagnosis Date   Arthritis    knees, rt shoulder   Hyperlipidemia    Hypertension    MI (myocardial infarction) [facility])    Past Surgical History:  Procedure Laterality Date   CARDIAC CATHETERIZATION     Angioplasty with stent placement x 2   COLONOSCOPY WITH PROPOFOL  N/A 06/01/2017   Procedure: COLONOSCOPY WITH PROPOFOL ;  Surgeon: Therisa Bi, MD;  Location: [facility];  Service: Gastroenterology;  Laterality: N/A;   KNEE ARTHROSCOPY WITH MENISCAL REPAIR Right 02/26/2024   Procedure: ARTHROSCOPY, KNEE, WITH MENISCUS REPAIR;  Surgeon: Genelle Standing, MD;  Location: [facility];  Service: Orthopedics;  Laterality: Right;  RIGHT KNEE ARTHROSCOPY WITH LATERAL MENISCUS REPAIR   OTHER SURGICAL HISTORY     2 Cardiac stents   TUMOR REMOVAL     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   Patient Active Problem List   Diagnosis Date Noted   Complex tear of lateral meniscus of right knee as current injury 02/26/2024   Hyperlipidemia    MI (myocardial infarction) (HCC)    Intracranial mass 01/27/2015   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   Anxiety 03/04/2014   Chronic coronary artery disease 03/04/2014   Depression 03/04/2014   Hypertension 03/04/2014   Shortness of breath 03/04/2014   PCP:  Epifanio Alm SQUIBB, MD  REFERRING PROVIDER: [name] Genelle, MD (orthopedics)  REFERRING DIAG: s/p Rt latera meniscus repair  THERAPY DIAG:  Acute pain of right knee  Stiffness of right knee, not elsewhere classified  Difficulty in walking, not elsewhere classified  Rationale for Evaluation and Treatment: Rehabilitation  ONSET DATE: 02/26/24  SUBJECTIVE:   SUBJECTIVE STATEMENT:   Pt states that he felt some soreness after last session, but he otherwise is doing well.    PERTINENT HISTORY: Knee pain and locking following a day of playing 4 basketball games, meniscus injury seen. Pt underwent lateral meniscus repair with Dr. Genelle on 6/3, placed in locked in extension, WBAT. At evaluation current pain: 6/10; Best pain: 0/10 Worst Pain: 6/10, on average has been around 4 PAIN:  Are you having pain? 0/10   PRECAUTIONS: WBAT, locked brace at all times when walking.  WEIGHT BEARING RESTRICTIONS: WBAT  FALLS:  Has patient fallen in last 6 months? No  LIVING ENVIRONMENT: Lives with: Wife Lives in: house  Stairs: 17 stairs to 2nd floor bedroom, no entry stairs  Has following equipment at home: crutches up to 6'6.   OCCUPATION: Retired   PLOF: Coaches basketball   PATIENT GOALS: Full recovery   NEXT MD VISIT: June 2025   OBJECTIVE:  Note: Objective measures were completed at Evaluation unless otherwise noted.  PATIENT SURVEYS:  LEFS: 20% 04/17/24: 78% (63/80). 06/26/24: 84% (67/80) EDEMA:  Unable to see, still ace wrapped  LOWER EXTREMITY ROM:  A/PROM Right eval Left eval Right   04/23/24  Right  04/28/24 Right  04/30/24 Right  05/06/24 Right  05/13/24   Hip flexion         Hip extension         Hip abduction         Hip adduction         Hip internal rotation         Hip external rotation         Knee flexion 65 degrees 125 deg  115/NT  117/NT  118/NT 123/NT 125 deg   Knee extension  5 degrees 0 deg    0/NT  0 deg   Knee internal rotation         Knee external  rotation         Knee abduction         Knee adduction         Ankle dorsiflexion         Ankle plantarflexion         Ankle inversion         Ankle eversion          (Blank rows = not tested)      MMT   Right 05/13/2022 Left 05/13/2022  Hip flexion 4 4+  Hip extension    Hip abduction 4 4+  Hip adduction    Hip internal rotation    Hip external rotation    Knee flexion 4+ 4  Knee extension 4+ 4  Ankle dorsiflexion    Ankle plantarflexion    Ankle inversion    Ankle eversion     (Blank rows = not tested)    1RM TESTING (calculated from <4RM, 06/26/2024) Single leg press at [facility], seat position 4 (meant to be 5 but missed) with toe at top of plate:  R: 873.4# (calculated from 3RM 115#) L: 144# (calculated from 2RM 135#) LSI: 87.8%  Single knee extension at [facility] machine, seat position 5 R: 90.7# (calculated from 2RM 85#) L: 115# (actual 1RM) LSI: 78.9%   TODAY'S TREATMENT   07/02/24: [facility]   Forward step down (8 step) on RLE into squat 1 x 10  Double Limb Box Jumps on 8 inch step  2 x 10   Skater Jumps 2 x 10   Single Leg Hop on RLE on trampoline x 20   Single Leg Hop on RLE on floor x 20   Engineer, manufacturing on RLE with emphasis on power with eccentric phase going into jump 2 x 10  -min VC to increase upright posture Rapid Heel Raises on RLE with BUE support 2 x 15       PATIENT EDUCATION:  Education details: Tissue healing timeline and explanation of car seat position and LE dermatomes  Person educated: Patient and spouse  Education method: Research scientist (medical), deliberate practice, positive reinforcement, explicit instruction, establish rules. Education comprehension: excellent  HOME EXERCISE PROGRAM: Do not print   Access Code: [facility] URL: [email or web address] Date: 06/30/2024 Prepared by: Toribio Servant  Program Notes Scissor Jumps 7 days per week -1x per day- 20    Exercises - Prone Quadriceps Stretch with Strap  - 1  x daily - 7 x weekly - 3 reps - 60 sec  hold - Seated Hip External Rotation Stretch  - 1 x daily - 7  x weekly - 3 reps - 60 sec  hold - Engineer, manufacturing  - 3-4 x weekly - 3 sets - 10 reps - Forward Step Down  - 3-4 x weekly - 3 sets - 10 reps - Jumping Rope  - 1 x daily - 7 x weekly - 20 reps - Sideways Tape Jumps  - 1 x daily - 7 x weekly - 20 reps - Forward Tape Jumps  - 1 x daily - 7 x weekly - 20 reps   ASSESSMENT:  CLINICAL IMPRESSION:   Pt continues to show ongoing improvement in right knee strength with ability to perform single leg jumps without pain and with ability to clear heel off floor. PT is now transitioning patient to single leg plyometric phase of return to sport, with ongoing emphasis on therapeutic activity by improving power, coordination, and dynamic balance in RLE to return to return to running and jumping for basketball. He will continue to benefit from continued management of limiting condition by skilled physical therapist to address remaining impairments and functional limitations to work towards stated goals and return to PLOF or maximal functional independence.    OBJECTIVE IMPAIRMENTS:  Decreased knowledge of condition, decreased use of DME, decreased mobility, difficulty walking, decreased strength, decreased ROM. ACTIVITY LIMITATIONS: Lifting, standing, walking, squatting, transfers, locomotion level PARTICIPATION LIMITATIONS: Cleaning, laundry, interpersonal relationships, driving, yardwork, community activity.  PERSONAL FACTORS: Age, behavior pattern, education, past/current experiences, transportation, profession  are also affecting patient's functional outcome.  REHAB POTENTIAL: Great  CLINICAL DECISION MAKING: Great EVALUATION COMPLEXITY: Low  GOALS: Goals reviewed with patient? yes  SHORT TERM GOALS: Target date: 04/05/24 Patient will report comprehension, confidence, and consistent compliance and of a simple home exercise program established to  facilitate symptoms management and basic strengthening and/or segment mobility.  Baseline: issued at eval 03/12/24: Able to perform HEP independently  Goal status: ACHIEVED    2.  Patient to demonstrate improved score on self-report measure by >9% to indicate reduced self-reported disability and/or pain.    Baseline: LEFS: 20%   04/17/24: 78% Goal status: ACHIEVED  3.  Pt to demonstrate Rt knee RM 0-90 degrees  Baseline: eval: 5-65 deg 03/20/24: 0-83  04/02/24: 0-90 deg on RLE  Goal status: ACHIEVED   LONG TERM GOALS: Target date: 08/22/24  Pt to demonstrate ability to perform >1511ft without pain limitation, less than 2/10 increase in NPRS to improve ability to participate in IADL and social events.  Baseline: 1,200 ft 04/30/24: 1650 ft   Goal status: ACHIEVED     2.  Pt to demonstrate less than 10% strength deficit in between bilat knee extension and bilat ankle PF to prepare for return to leisure and community activity.  Baseline: Leg Press 1 RM R/L 80/160  Knee Ext 1 RM R/L  55/115  Single Leg Calf Raise R/L partial heel raise/ full heel raise on LLE 05/20/24:  Leg Press 1 RM R/L 105/160  Knee Ext 1 RM R/L  65/115. 06/26/24: Leg press calculated 1RM  R/L 126.5/144, Knee Ext calculated 1RM R/L 90.7/115, ankle PF testing deferred.  Goal status: IN PROGRESS  3.  Patient to demonstrate improved performance on initial transfers assessment AEB improved reps per time or time per perform desired reps and/or reduced seat height and/or use of hands in order to improve safety, tolerance, and independence in ADL performance.   Baseline: Goal not appropriate for measuring LE endurance for pt's age Goal status: discontinued  4.  Pt to demonstrate <10%  difference between single leg hop bilaterally  Baseline: Not able to perform due to restrictions. 06/30/24: Single Leg Hop R/L 35/NT    Goal status: ONGOING   PLAN:  PT FREQUENCY: 1-2x/week  PT DURATION: 12 weeks PLANNED INTERVENTIONS:  97110-Therapeutic exercises, 97530- Therapeutic activity, 97112- Neuromuscular re-education, 97535- Self Care, 02859- Manual therapy, [phone]- Gait training, [phone]- Electrical stimulation (unattended), [phone]- Electrical stimulation (manual), Patient/Family education, Balance training, Stair training, Joint mobilization, Joint manipulation, Spinal manipulation, and Spinal mobilization  PLAN FOR NEXT SESSION: Continue to focus on single leg hops with forward and backward and side to side single hop as well as forward single leg hop for distance. Focus on power compenent with LE press with speed component on RLE.  Dynamic balance using blaze pods.    Toribio Servant PT, DPT  [facility] [address], [place], [address] Phone: [phone]   Fax:  [phone]

## 2024-07-07 ENCOUNTER — Ambulatory Visit: Payer: Self-pay | Admitting: Physical Therapy

## 2024-07-07 ENCOUNTER — Encounter: Payer: Self-pay | Admitting: Physical Therapy

## 2024-07-07 DIAGNOSIS — M25561 Pain in right knee: Secondary | ICD-10-CM | POA: Diagnosis not present

## 2024-07-07 DIAGNOSIS — R262 Difficulty in walking, not elsewhere classified: Secondary | ICD-10-CM

## 2024-07-07 DIAGNOSIS — M25661 Stiffness of right knee, not elsewhere classified: Secondary | ICD-10-CM

## 2024-07-07 NOTE — Therapy (Unsigned)
 OUTPATIENT PHYSICAL THERAPY TREATMENT     Patient Name: Gregory Banks. MRN: [id number] DOB:[birth year], 58 y.o., male Today's Date: 07/07/2024  END OF SESSION:  PT End of Session - 07/07/24 1820     Visit Number 23    Number of Visits 24    Date for Recertification  08/22/24    Authorization Type Medicare primary; eneric commercial 2ndary    Authorization - Visit Number 23    Authorization - Number of Visits 24    Progress Note Due on Visit 24    PT Start Time 1815    PT Stop Time 1900    PT Time Calculation (min) 45 min    Activity Tolerance Patient tolerated treatment well;No increased pain    Behavior During Therapy WFL for tasks assessed/performed            Past Medical History:  Diagnosis Date   Arthritis    knees, rt shoulder   Hyperlipidemia    Hypertension    MI (myocardial infarction) [facility])    Past Surgical History:  Procedure Laterality Date   CARDIAC CATHETERIZATION     Angioplasty with stent placement x 2   COLONOSCOPY WITH PROPOFOL  N/A 06/01/2017   Procedure: COLONOSCOPY WITH PROPOFOL ;  Surgeon: Therisa Bi, MD;  Location: [facility] ENDOSCOPY;  Service: Gastroenterology;  Laterality: N/A;   KNEE ARTHROSCOPY WITH MENISCAL REPAIR Right 02/26/2024   Procedure: ARTHROSCOPY, KNEE, WITH MENISCUS REPAIR;  Surgeon: Genelle Standing, MD;  Location: [facility];  Service: Orthopedics;  Laterality: Right;  RIGHT KNEE ARTHROSCOPY WITH LATERAL MENISCUS REPAIR   OTHER SURGICAL HISTORY     2 Cardiac stents   TUMOR REMOVAL     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   Patient Active Problem List   Diagnosis Date Noted   Complex tear of lateral meniscus of right knee as current injury 02/26/2024   Hyperlipidemia    MI (myocardial infarction) (HCC)    Intracranial mass 01/27/2015   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   Anxiety 03/04/2014   Chronic coronary artery disease 03/04/2014   Depression 03/04/2014   Hypertension 03/04/2014   Shortness of breath 03/04/2014   PCP:  Epifanio Alm SQUIBB, MD  REFERRING PROVIDER: [name] Genelle, MD (orthopedics)  REFERRING DIAG: s/p Rt latera meniscus repair  THERAPY DIAG:  Acute pain of right knee  Stiffness of right knee, not elsewhere classified  Difficulty in walking, not elsewhere classified  Rationale for Evaluation and Treatment: Rehabilitation  ONSET DATE: 02/26/24  SUBJECTIVE:   SUBJECTIVE STATEMENT:   Pt reports increased pain in right knee but he thinks this is more to standing all day for church.    PERTINENT HISTORY: Knee pain and locking following a day of playing 4 basketball games, meniscus injury seen. Pt underwent lateral meniscus repair with Dr. Genelle on 6/3, placed in locked in extension, WBAT. At evaluation current pain: 6/10; Best pain: 0/10 Worst Pain: 6/10, on average has been around 4 PAIN:  Are you having pain? 0/10   PRECAUTIONS: WBAT, locked brace at all times when walking.  WEIGHT BEARING RESTRICTIONS: WBAT  FALLS:  Has patient fallen in last 6 months? No  LIVING ENVIRONMENT: Lives with: Wife Lives in: house  Stairs: 17 stairs to 2nd floor bedroom, no entry stairs  Has following equipment at home: crutches up to 6'6.   OCCUPATION: Retired   PLOF: Coaches basketball   PATIENT GOALS: Full recovery   NEXT MD VISIT: June 2025   OBJECTIVE:  Note: Objective measures were completed at Evaluation  unless otherwise noted.  PATIENT SURVEYS:  LEFS: 20% 04/17/24: 78% (63/80). 06/26/24: 84% (67/80) EDEMA:  Unable to see, still ace wrapped  LOWER EXTREMITY ROM:  A/PROM Right eval Left eval Right   04/23/24  Right  04/28/24 Right  04/30/24 Right  05/06/24 Right  05/13/24   Hip flexion         Hip extension         Hip abduction         Hip adduction         Hip internal rotation         Hip external rotation         Knee flexion 65 degrees 125 deg  115/NT  117/NT  118/NT 123/NT 125 deg   Knee extension  5 degrees 0 deg    0/NT  0 deg   Knee internal rotation         Knee  external rotation         Knee abduction         Knee adduction         Ankle dorsiflexion         Ankle plantarflexion         Ankle inversion         Ankle eversion          (Blank rows = not tested)      MMT   Right 05/13/2022 Left 05/13/2022  Hip flexion 4 4+  Hip extension    Hip abduction 4 4+  Hip adduction    Hip internal rotation    Hip external rotation    Knee flexion 4+ 4  Knee extension 4+ 4  Ankle dorsiflexion    Ankle plantarflexion    Ankle inversion    Ankle eversion     (Blank rows = not tested)    1RM TESTING (calculated from <4RM, 06/26/2024) Single leg press at [facility], seat position 4 (meant to be 5 but missed) with toe at top of plate:  R: 873.4# (calculated from 3RM 115#) L: 144# (calculated from 2RM 135#) LSI: 87.8%  Single knee extension at [facility] machine, seat position 5 R: 90.7# (calculated from 2RM 85#) L: 115# (actual 1RM) LSI: 78.9%   TODAY'S TREATMENT   07/07/24:  THERAC: All single leg activity performed on RLE    TM with BUE support 5 min   [place]  on RLE 1 x 10 with no UE support 1 x 10  -Decreased foot clearance  Lunge Jump  with RLE forward 1 x 10    Single Leg  Jumps in place 1 x 20 Single Leg  Side to Side  1 x 20  -Pt unable to perform continuously due to decreased RLE strength and increased fatigue Single Leg Forward and Backward 1 x 20   NMR   Forward Hop Down    Forward Hop Down from 6 inch step onto RLE  1 x 10   Blaze Pods RLE Single Leg Stance 4 pod taps  30 sec game x 3   Blaze Pods RLE Single Leg Stance on foam 4 pod taps 30 sec game x 3      PATIENT EDUCATION:  Education details: Tissue healing timeline and explanation of car seat position and LE dermatomes  Person educated: Patient and spouse  Education method: Research scientist (medical), deliberate practice, positive reinforcement, explicit instruction, establish rules. Education comprehension: excellent  HOME EXERCISE PROGRAM: Do not print   Access  Code: [facility] URL: [email or web address]  Date: 07/07/2024 Prepared by: Toribio Servant  Exercises - Prone Quadriceps Stretch with Strap  - 1 x daily - 7 x weekly - 3 reps - 60 sec  hold - Seated Hip External Rotation Stretch  - 1 x daily - 7 x weekly - 3 reps - 60 sec  hold - Box Jump  - 1 x daily - 7 x weekly - 10 reps - Lateral Single Leg Lunge Jumps  - 1 x daily - 7 x weekly - 1 sets - 20 reps - Jump Lunge on Right   - 1 x daily - 7 x weekly - 10 reps - Single Leg Heel Raise with Counter Support  - 3-4 x weekly - 3 sets - 15 reps - Single Leg Jump Rope  - 1 x daily - 7 x weekly - 1 sets - 20 reps - Single Leg Jumps Forward and Backward  - 1 x daily - 7 x weekly - 1 sets - 20- reps - Step Off Platform with landing on one leg    - 1 x daily - 7 x weekly - 1 sets - 20 reps  ASSESSMENT:  CLINICAL IMPRESSION:   Pt progressing towards goals with ability to perform single leg hops on RLE without increase in his pain. He does show decreased knee flexion during landing on RLE, but he was able to correct with additional massed practice performing step off onto RLE. Pt also exhibits decreased trunk control when performing repetitive jumps and he will benefit from further abdominal strengthening and proprioception interventions. PT focused on neuromuscular re-education of right knee in single leg stance for improved landing mechanics as well as RLE strengthening to increased height of single leg jump and stability when landing. He is very close to initiating walk to run program to return to running with only remaining step to perform continuous single leg hops for 200 foot contacts. He will continue to benefit from continued management of limiting condition by skilled physical therapist to address remaining impairments and functional limitations to work towards stated goals and return to PLOF or maximal functional independence.  OBJECTIVE IMPAIRMENTS:  Decreased knowledge of condition,  decreased use of DME, decreased mobility, difficulty walking, decreased strength, decreased ROM. ACTIVITY LIMITATIONS: Lifting, standing, walking, squatting, transfers, locomotion level PARTICIPATION LIMITATIONS: Cleaning, laundry, interpersonal relationships, driving, yardwork, community activity.  PERSONAL FACTORS: Age, behavior pattern, education, past/current experiences, transportation, profession  are also affecting patient's functional outcome.  REHAB POTENTIAL: Great  CLINICAL DECISION MAKING: Great EVALUATION COMPLEXITY: Low  GOALS: Goals reviewed with patient? yes  SHORT TERM GOALS: Target date: 04/05/24 Patient will report comprehension, confidence, and consistent compliance and of a simple home exercise program established to facilitate symptoms management and basic strengthening and/or segment mobility.  Baseline: issued at eval 03/12/24: Able to perform HEP independently  Goal status: ACHIEVED    2.  Patient to demonstrate improved score on self-report measure by >9% to indicate reduced self-reported disability and/or pain.    Baseline: LEFS: 20%   04/17/24: 78% Goal status: ACHIEVED  3.  Pt to demonstrate Rt knee RM 0-90 degrees  Baseline: eval: 5-65 deg 03/20/24: 0-83  04/02/24: 0-90 deg on RLE  Goal status: ACHIEVED   LONG TERM GOALS: Target date: 08/22/24  Pt to demonstrate ability to perform >1553ft without pain limitation, less than 2/10 increase in NPRS to improve ability to participate in IADL and social events.  Baseline: 1,200 ft 04/30/24: 1650 ft   Goal status: ACHIEVED  2.  Pt to demonstrate less than 10% strength deficit in between bilat knee extension and bilat ankle PF to prepare for return to leisure and community activity.  Baseline: Leg Press 1 RM R/L 80/160  Knee Ext 1 RM R/L  55/115  Single Leg Calf Raise R/L partial heel raise/ full heel raise on LLE 05/20/24:  Leg Press 1 RM R/L 105/160  Knee Ext 1 RM R/L  65/115. 06/26/24: Leg press calculated 1RM   R/L 126.5/144, Knee Ext calculated 1RM R/L 90.7/115, ankle PF testing deferred.  Goal status: IN PROGRESS  3.  Patient to demonstrate improved performance on initial transfers assessment AEB improved reps per time or time per perform desired reps and/or reduced seat height and/or use of hands in order to improve safety, tolerance, and independence in ADL performance.   Baseline: Goal not appropriate for measuring LE endurance for pt's age Goal status: discontinued  4.  Pt to demonstrate <10% difference between single leg hop bilaterally  Baseline: Not able to perform due to restrictions. 06/30/24: Single Leg Hop R/L 35/NT    Goal status: ONGOING   PLAN:  PT FREQUENCY: 1-2x/week  PT DURATION: 12 weeks PLANNED INTERVENTIONS: 97110-Therapeutic exercises, 97530- Therapeutic activity, 97112- Neuromuscular re-education, 97535- Self Care, 02859- Manual therapy, [phone]- Gait training, [phone]- Electrical stimulation (unattended), [phone]- Electrical stimulation (manual), Patient/Family education, Balance training, Stair training, Joint mobilization, Joint manipulation, Spinal manipulation, and Spinal mobilization  PLAN FOR NEXT SESSION: Re-eval. Continuous single leg hop. Return to running protocol: 4 min walk to 1 min run. Continue to focus on single leg hops with forward and backward and side to side single hop as well as forward single leg hop for distance. Focus on power compenent with LE press with speed component on RLE.  Dynamic balance using blaze pods.    Toribio Servant PT, DPT  [facility] [address], [place], [address] Phone: [phone]   Fax:  [phone]

## 2024-07-09 ENCOUNTER — Ambulatory Visit: Admitting: Physical Therapy

## 2024-07-09 DIAGNOSIS — R262 Difficulty in walking, not elsewhere classified: Secondary | ICD-10-CM

## 2024-07-09 DIAGNOSIS — M25561 Pain in right knee: Secondary | ICD-10-CM | POA: Diagnosis not present

## 2024-07-09 DIAGNOSIS — M25661 Stiffness of right knee, not elsewhere classified: Secondary | ICD-10-CM

## 2024-07-09 NOTE — Therapy (Signed)
 OUTPATIENT PHYSICAL THERAPY TREATMENT NOTE/ RE-CERTIFICATION    Patient Name: Gregory Banks. MRN: [id number] DOB:October 17, 1965, 58 y.o., male Today's Date: 07/09/2024  END OF SESSION:  PT End of Session - 07/09/24 1310     Visit Number 24    Number of Visits 24    Date for Recertification  08/22/24    Authorization Type Medicare primary; en[employer] commercial 2ndary    Authorization - Visit Number 24    Authorization - Number of Visits 35    Progress Note Due on Visit 30    PT Start Time 1300    PT Stop Time 1345    PT Time Calculation (min) 45 min    Activity Tolerance Patient tolerated treatment well;No increased pain    Behavior During Therapy WFL for tasks assessed/performed             Past Medical History:  Diagnosis Date   Arthritis    knees, rt shoulder   Hyperlipidemia    Hypertension    MI (myocardial infarction) [facility])    Past Surgical History:  Procedure Laterality Date   CARDIAC CATHETERIZATION     Angioplasty with stent placement x 2   COLONOSCOPY WITH PROPOFOL  N/A 06/01/2017   Procedure: COLONOSCOPY WITH PROPOFOL ;  Surgeon: Therisa Bi, MD;  Location: [facility] ENDOSCOPY;  Service: Gastroenterology;  Laterality: N/A;   KNEE ARTHROSCOPY WITH MENISCAL REPAIR Right 02/26/2024   Procedure: ARTHROSCOPY, KNEE, WITH MENISCUS REPAIR;  Surgeon: Genelle Standing, MD;  Location: [facility];  Service: Orthopedics;  Laterality: Right;  RIGHT KNEE ARTHROSCOPY WITH LATERAL MENISCUS REPAIR   OTHER SURGICAL HISTORY     2 Cardiac stents   TUMOR REMOVAL     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   Patient Active Problem List   Diagnosis Date Noted   Complex tear of lateral meniscus of right knee as current injury 02/26/2024   Hyperlipidemia    MI (myocardial infarction) (HCC)    Intracranial mass 01/27/2015   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   Anxiety 03/04/2014   Chronic coronary artery disease 03/04/2014   Depression 03/04/2014   Hypertension 03/04/2014   Shortness of breath  03/04/2014   PCP: Epifanio Alm SQUIBB, MD  REFERRING PROVIDER: [name] Genelle, MD (orthopedics)  REFERRING DIAG: s/p Rt latera meniscus repair  THERAPY DIAG:  Acute pain of right knee  Stiffness of right knee, not elsewhere classified  Difficulty in walking, not elsewhere classified  Rationale for Evaluation and Treatment: Rehabilitation  ONSET DATE: 02/26/24  SUBJECTIVE:   SUBJECTIVE STATEMENT:   Pt states that his right knee did not experience an increase in pain after last session. He has been able to perform exercises at home.    PERTINENT HISTORY: Knee pain and locking following a day of playing 4 basketball games, meniscus injury seen. Pt underwent lateral meniscus repair with Dr. Genelle on 6/3, placed in locked in extension, WBAT. At evaluation current pain: 6/10; Best pain: 0/10 Worst Pain: 6/10, on average has been around 4 PAIN:  Are you having pain? 0/10   PRECAUTIONS: WBAT, locked brace at all times when walking.  WEIGHT BEARING RESTRICTIONS: WBAT  FALLS:  Has patient fallen in last 6 months? No  LIVING ENVIRONMENT: Lives with: Wife Lives in: house  Stairs: 17 stairs to 2nd floor bedroom, no entry stairs  Has following equipment at home: crutches up to 6'6.   OCCUPATION: Retired   PLOF: Coaches basketball   PATIENT GOALS: Full recovery   NEXT MD VISIT: June 2025   OBJECTIVE:  Note: Objective measures were completed at Evaluation unless otherwise noted.  PATIENT SURVEYS:  LEFS: 20% 04/17/24: 78% (63/80). 06/26/24: 84% (67/80) EDEMA:  Unable to see, still ace wrapped  LOWER EXTREMITY ROM:  A/PROM Right eval Left eval Right   04/23/24  Right  04/28/24 Right  04/30/24 Right  05/06/24 Right  05/13/24   Hip flexion         Hip extension         Hip abduction         Hip adduction         Hip internal rotation         Hip external rotation         Knee flexion 65 degrees 125 deg  115/NT  117/NT  118/NT 123/NT 125 deg   Knee extension  5 degrees 0 deg     0/NT  0 deg   Knee internal rotation         Knee external rotation         Knee abduction         Knee adduction         Ankle dorsiflexion         Ankle plantarflexion         Ankle inversion         Ankle eversion          (Blank rows = not tested)      MMT   Right 05/13/2022 Left 05/13/2022  Hip flexion 4 4+  Hip extension    Hip abduction 4 4+  Hip adduction    Hip internal rotation    Hip external rotation    Knee flexion 4+ 4  Knee extension 4+ 4  Ankle dorsiflexion    Ankle plantarflexion    Ankle inversion    Ankle eversion     (Blank rows = not tested)    1RM TESTING (calculated from <4RM, 06/26/2024) Single leg press at [facility], seat position 4 (meant to be 5 but missed) with toe at top of plate:  R: 873.4# (calculated from 3RM 115#) L: 144# (calculated from 2RM 135#) LSI: 87.8%  Single knee extension at [facility] machine, seat position 5 R: 90.7# (calculated from 2RM 85#) L: 115# (actual 1RM) LSI: 78.9%   TODAY'S TREATMENT   07/09/24    THERAC   Matrix cycle with seat at 21 with no resistance   Single Leg Hop on RLE  24 inch   (TM 4 min walk (1.5 mph) 1 min (4 mph)) x 2   -Pt shows decreased arm swing during job Single Leg RDL into Power Single Leg Hop on RLE  1 x 10    -mod VC to perform with opposite or LUE arm swing    Single Leg RDL into Power Single Leg Hop on RLE  1 X 10  -positioned pt with left side against wall in case of loss of balance and to free is LUE from having to reach out for UE support to maintain balance   RLE Forward Hops x 5  with wall nearby for 2 sets    Review of home exercise plan incliuding return to running plan for 4 min walk by 1 min run x 3-4 cycles 3 days per week      PATIENT EDUCATION:  Education details: Tissue healing timeline and explanation of car seat position and LE dermatomes  Person educated: Patient and spouse  Education method: Research scientist (medical), deliberate practice, positive reinforcement,  explicit  instruction, establish rules. Education comprehension: excellent  HOME EXERCISE PROGRAM: Do not print   Access Code: [facility] URL: [email or web address] Date: 07/09/2024 Prepared by: Toribio Servant  Program Notes Return to running program -4 min walk and 1 min run  3-4x , 2-3 days per week Power skip on RLE with Left arm swing though 3 sets of 10 reps 3-4 x per week  Forward  hops on RLE with Arm against wall for support  2 SETS OF 5 reps x 7 days per week    Exercises - Prone Quadriceps Stretch with Strap  - 1 x daily - 7 x weekly - 3 reps - 60 sec  hold - Seated Hip External Rotation Stretch  - 1 x daily - 7 x weekly - 3 reps - 60 sec  hold - Lateral Single Leg Lunge Jumps  - 1 x daily - 7 x weekly - 1 sets - 20 reps - Jump Lunge on Right   - 1 x daily - 7 x weekly - 10 reps - Single Leg Jump Rope  - 1 x daily - 7 x weekly - 1 sets - 20 reps - Step Off Platform with landing on one leg    - 1 x daily - 7 x weekly - 1 sets - 20 reps  ASSESSMENT:  CLINICAL IMPRESSION:   Pt continues to show progress towards goals with ability to initiate return to running with first phase of return to running protocol and initiate jog for first time. He continues to have difficulty with performing single leg hop especially landing on RLE. Pt also exhibits lack of trunk control and mobility during plyometrics and running with limited arm swing and increased sway outside of base of support. PT focused on therapeutic activity that focused on strengthening, muscle coordination, and single leg balance to promote increased trunk stability and single leg stance to return to running. Pt has still not met all of his rehab goals with single leg hop and RLE strength remaining. He will continue to benefit from skilled PT to return to hopping on single leg to return to physical activity and to maintain balance while walking over uneven terrain to keep from falling.   Decreased knowledge of  condition, decreased use of DME, decreased mobility, difficulty walking, decreased strength, decreased ROM. ACTIVITY LIMITATIONS: Lifting, standing, walking, squatting, transfers, locomotion level PARTICIPATION LIMITATIONS: Cleaning, laundry, interpersonal relationships, driving, yardwork, community activity.  PERSONAL FACTORS: Age, behavior pattern, education, past/current experiences, transportation, profession  are also affecting patient's functional outcome.  REHAB POTENTIAL: Great  CLINICAL DECISION MAKING: Great EVALUATION COMPLEXITY: Low  GOALS: Goals reviewed with patient? yes  SHORT TERM GOALS: Target date: 04/05/24 Patient will report comprehension, confidence, and consistent compliance and of a simple home exercise program established to facilitate symptoms management and basic strengthening and/or segment mobility.  Baseline: issued at eval 03/12/24: Able to perform HEP independently  Goal status: ACHIEVED    2.  Patient to demonstrate improved score on self-report measure by >9% to indicate reduced self-reported disability and/or pain.    Baseline: LEFS: 20%   04/17/24: 78% Goal status: ACHIEVED  3.  Pt to demonstrate Rt knee RM 0-90 degrees  Baseline: eval: 5-65 deg 03/20/24: 0-83  04/02/24: 0-90 deg on RLE  Goal status: ACHIEVED   LONG TERM GOALS: Target date: 08/22/24  Pt to demonstrate ability to perform >1530ft without pain limitation, less than 2/10 increase in NPRS to improve ability to participate in IADL and social events.  Baseline:  1,200 ft 04/30/24: 1650 ft   Goal status: ACHIEVED     2.  Pt to demonstrate less than 10% strength deficit in between bilat knee extension and bilat ankle PF to prepare for return to leisure and community activity.  Baseline: Leg Press 1 RM R/L 80/160  Knee Ext 1 RM R/L  55/115  Single Leg Calf Raise R/L partial heel raise/ full heel raise on LLE 05/20/24:  Leg Press 1 RM R/L 105/160  Knee Ext 1 RM R/L  65/115. 06/26/24: Leg press  calculated 1RM  R/L 126.5/144, Knee Ext calculated 1RM R/L 90.7/115, ankle PF testing deferred.  Goal status: IN PROGRESS  3.  Patient to demonstrate improved performance on initial transfers assessment AEB improved reps per time or time per perform desired reps and/or reduced seat height and/or use of hands in order to improve safety, tolerance, and independence in ADL performance.   Baseline: Goal not appropriate for measuring LE endurance for pt's age Goal status: discontinued  4.  Pt to demonstrate <10% difference between single leg hop bilaterally  Baseline: Not able to perform due to restrictions. 06/30/24: Single Leg Hop R/L 35/NT  07/09/24: Starting on RLE and landing on RLE  24 forward hop   Goal status: ONGOING   PLAN:  PT FREQUENCY: 1-2x/week  PT DURATION: 12 weeks PLANNED INTERVENTIONS: 97110-Therapeutic exercises, 97530- Therapeutic activity, 97112- Neuromuscular re-education, 97535- Self Care, 02859- Manual therapy, [phone]- Gait training, [phone]- Electrical stimulation (unattended), [phone]- Electrical stimulation (manual), Patient/Family education, Balance training, Stair training, Joint mobilization, Joint manipulation, Spinal manipulation, and Spinal mobilization  PLAN FOR NEXT SESSION: Re-eval. Continuous single leg hop. Return to running protocol: 4 min walk to 1 min run. Continue to focus on single leg hops with forward and backward and side to side single hop as well as forward single leg hop for distance. Focus on power compenent with LE press with speed component on RLE.  Dynamic balance using blaze pods.    Toribio Servant PT, DPT  [facility] [address] [place], [address] Phone: [phone]   Fax:  [phone]

## 2024-07-15 ENCOUNTER — Ambulatory Visit: Admitting: Physical Therapy

## 2024-07-15 DIAGNOSIS — M25561 Pain in right knee: Secondary | ICD-10-CM | POA: Diagnosis not present

## 2024-07-15 DIAGNOSIS — M25661 Stiffness of right knee, not elsewhere classified: Secondary | ICD-10-CM

## 2024-07-15 DIAGNOSIS — R262 Difficulty in walking, not elsewhere classified: Secondary | ICD-10-CM

## 2024-07-15 NOTE — Therapy (Signed)
 OUTPATIENT PHYSICAL THERAPY TREATMENT NOTE   Patient Name: Gregory Banks. 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    Authorization - Visit Number 25    Authorization - Number of Visits 35    Progress Note Due on Visit 30    PT Start Time 1300    PT Stop Time 1345    PT Time Calculation (min) 45 min    Activity Tolerance Patient tolerated treatment well;No increased pain    Behavior During Therapy WFL for tasks assessed/performed              Past Medical History:  Diagnosis Date   Arthritis    knees, rt shoulder   Hyperlipidemia    Hypertension    MI (myocardial infarction) [facility])    Past Surgical History:  Procedure Laterality Date   CARDIAC CATHETERIZATION     Angioplasty with stent placement x 2   COLONOSCOPY WITH PROPOFOL  N/A 06/01/2017   Procedure: COLONOSCOPY WITH PROPOFOL ;  Surgeon: Therisa Bi, MD;  Location: [facility] ENDOSCOPY;  Service: Gastroenterology;  Laterality: N/A;   KNEE ARTHROSCOPY WITH MENISCAL REPAIR Right 02/26/2024   Procedure: ARTHROSCOPY, KNEE, WITH MENISCUS REPAIR;  Surgeon: Genelle Standing, MD;  Location: [facility];  Service: Orthopedics;  Laterality: Right;  RIGHT KNEE ARTHROSCOPY WITH LATERAL MENISCUS REPAIR   OTHER SURGICAL HISTORY     2 Cardiac stents   TUMOR REMOVAL     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   Patient Active Problem List   Diagnosis Date Noted   Complex tear of lateral meniscus of right knee as current injury 02/26/2024   Hyperlipidemia    MI (myocardial infarction) (HCC)    Intracranial mass 01/27/2015   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   Anxiety 03/04/2014   Chronic coronary artery disease 03/04/2014   Depression 03/04/2014   Hypertension 03/04/2014   Shortness of breath 03/04/2014   PCP:  Epifanio Alm SQUIBB, MD  REFERRING PROVIDER: [name] Genelle, MD (orthopedics)  REFERRING DIAG: s/p Rt latera meniscus repair  THERAPY DIAG:  Acute pain of right knee  Stiffness of right knee, not elsewhere classified  Difficulty in walking, not elsewhere classified  Rationale for Evaluation and Treatment: Rehabilitation  ONSET DATE: 02/26/24  SUBJECTIVE:   SUBJECTIVE STATEMENT:   Pt reports that he was able to perform walk and jog routine without an increase in his right knee pain.  PERTINENT HISTORY: Knee pain and locking following a day of playing 4 basketball games, meniscus injury seen. Pt underwent lateral meniscus repair with Dr. Genelle on 6/3, placed in locked in extension, WBAT. At evaluation current pain: 6/10; Best pain: 0/10 Worst Pain: 6/10, on average has been around 4 PAIN:  Are you having pain? 0/10   PRECAUTIONS: WBAT, locked brace at all times when walking.  WEIGHT BEARING RESTRICTIONS: WBAT  FALLS:  Has patient fallen in last 6 months? No  LIVING ENVIRONMENT: Lives with: Wife Lives in: house  Stairs: 17 stairs to 2nd floor bedroom, no entry stairs  Has following equipment at home: crutches up to 6'6.   OCCUPATION: Retired   PLOF: Coaches basketball   PATIENT GOALS: Full recovery   NEXT MD VISIT: June 2025   OBJECTIVE:  Note: Objective measures were completed at Evaluation  unless otherwise noted.  PATIENT SURVEYS:  LEFS: 20% 04/17/24: 78% (63/80). 06/26/24: 84% (67/80) EDEMA:  Unable to see, still ace wrapped  LOWER EXTREMITY ROM:  A/PROM Right eval Left eval Right   04/23/24  Right  04/28/24 Right  04/30/24 Right  05/06/24 Right  05/13/24   Hip flexion         Hip extension         Hip abduction         Hip adduction         Hip internal rotation         Hip external rotation         Knee flexion 65 degrees 125 deg  115/NT  117/NT  118/NT 123/NT 125 deg   Knee extension  5 degrees 0 deg    0/NT  0 deg   Knee internal rotation         Knee  external rotation         Knee abduction         Knee adduction         Ankle dorsiflexion         Ankle plantarflexion         Ankle inversion         Ankle eversion          (Blank rows = not tested)      MMT   Right 05/13/2022 Left 05/13/2022  Hip flexion 4 4+  Hip extension    Hip abduction 4 4+  Hip adduction    Hip internal rotation    Hip external rotation    Knee flexion 4+ 4  Knee extension 4+ 4  Ankle dorsiflexion    Ankle plantarflexion    Ankle inversion    Ankle eversion     (Blank rows = not tested)    1RM TESTING (calculated from <4RM, 06/26/2024) Single leg press at [facility], seat position 4 (meant to be 5 but missed) with toe at top of plate:  R: 873.4# (calculated from 3RM 115#) L: 144# (calculated from 2RM 135#) LSI: 87.8%  Single knee extension at [facility] machine, seat position 5 R: 90.7# (calculated from 2RM 85#) L: 115# (actual 1RM) LSI: 78.9%   TODAY'S TREATMENT   07/15/24: THERAC   TM 3 min (2.0 mph), 2 min (4.0 mph) x 2  Lateral Step Down onto RLE from 8 inch step  1 x 10   -min VC to maintain balance on RLE  Skater Jumps over 3.5 inch obstacle while catching ball and throwing back 2 x 10     NMR Single Leg RLE as stance LLE  March with Med [facility] Up 1 x 10  Single Leg RLE as stance LLE March with #10 KB Press Up 1 x 10  Single Leg RLE as stance LLE March with #10 KB Press Up in RLE 1 x 10 Skater Jumps over 3.5 inch obstacle  2 x 10      PATIENT EDUCATION:  Education details: Tissue healing timeline and explanation of car seat position and LE dermatomes  Person educated: Patient and spouse  Education method: Research scientist (medical), deliberate practice, positive reinforcement, explicit instruction, establish rules. Education comprehension: excellent  HOME EXERCISE PROGRAM: Do not print   Access Code: [facility] URL: [email or web address] Date: 07/09/2024 Prepared by: Toribio Servant  Program Notes Return to  running program -4 min walk and 1 min run  3-4x , 2-3 days per week Power skip on RLE with Left  arm swing though 3 sets of 10 reps 3-4 x per week  Forward  hops on RLE with Arm against wall for support  2 SETS OF 5 reps x 7 days per week    Exercises - Prone Quadriceps Stretch with Strap  - 1 x daily - 7 x weekly - 3 reps - 60 sec  hold - Seated Hip External Rotation Stretch  - 1 x daily - 7 x weekly - 3 reps - 60 sec  hold - Lateral Single Leg Lunge Jumps  - 1 x daily - 7 x weekly - 1 sets - 20 reps - Jump Lunge on Right   - 1 x daily - 7 x weekly - 10 reps - Single Leg Jump Rope  - 1 x daily - 7 x weekly - 1 sets - 20 reps - Step Off Platform with landing on one leg    - 1 x daily - 7 x weekly - 1 sets - 20 reps  ASSESSMENT:  CLINICAL IMPRESSION:   Pt demonstrates ongoing progress towards rehab goals with increased neuromuscular control of right leg when landing from a single leg hop. He has also been able to increase amount of running to 2 minutes without an increase in right knee pain. He continues to be somewhat limited by increased tiredness likely from deconditioning of cardiovascular system.  He will continue to benefit from skilled PT to return to hopping on single leg to return to physical activity and to maintain balance while walking over uneven terrain to keep from falling.    Decreased knowledge of condition, decreased use of DME, decreased mobility, difficulty walking, decreased strength, decreased ROM. ACTIVITY LIMITATIONS: Lifting, standing, walking, squatting, transfers, locomotion level PARTICIPATION LIMITATIONS: Cleaning, laundry, interpersonal relationships, driving, yardwork, community activity.  PERSONAL FACTORS: Age, behavior pattern, education, past/current experiences, transportation, profession  are also affecting patient's functional outcome.  REHAB POTENTIAL: Great  CLINICAL DECISION MAKING: Great EVALUATION COMPLEXITY: Low  GOALS: Goals reviewed with patient?  yes  SHORT TERM GOALS: Target date: 04/05/24 Patient will report comprehension, confidence, and consistent compliance and of a simple home exercise program established to facilitate symptoms management and basic strengthening and/or segment mobility.  Baseline: issued at eval 03/12/24: Able to perform HEP independently  Goal status: ACHIEVED    2.  Patient to demonstrate improved score on self-report measure by >9% to indicate reduced self-reported disability and/or pain.    Baseline: LEFS: 20%   04/17/24: 78% Goal status: ACHIEVED  3.  Pt to demonstrate Rt knee RM 0-90 degrees  Baseline: eval: 5-65 deg 03/20/24: 0-83  04/02/24: 0-90 deg on RLE  Goal status: ACHIEVED   LONG TERM GOALS: Target date: 08/22/24  Pt to demonstrate ability to perform >1559ft without pain limitation, less than 2/10 increase in NPRS to improve ability to participate in IADL and social events.  Baseline: 1,200 ft 04/30/24: 1650 ft   Goal status: ACHIEVED     2.  Pt to demonstrate less than 10% strength deficit in between bilat knee extension and bilat ankle PF to prepare for return to leisure and community activity.  Baseline: Leg Press 1 RM R/L 80/160  Knee Ext 1 RM R/L  55/115  Single Leg Calf Raise R/L partial heel raise/ full heel raise on LLE 05/20/24:  Leg Press 1 RM R/L 105/160  Knee Ext 1 RM R/L  65/115. 06/26/24: Leg press calculated 1RM  R/L 126.5/144, Knee Ext calculated 1RM R/L 90.7/115, ankle PF testing deferred.  Goal  status: IN PROGRESS  3.  Patient to demonstrate improved performance on initial transfers assessment AEB improved reps per time or time per perform desired reps and/or reduced seat height and/or use of hands in order to improve safety, tolerance, and independence in ADL performance.   Baseline: Goal not appropriate for measuring LE endurance for pt's age Goal status: discontinued  4.  Pt to demonstrate <10% difference between single leg hop bilaterally  Baseline: Not able to perform due to  restrictions. 06/30/24: Single Leg Hop R/L 35/NT  07/09/24: Starting on RLE and landing on RLE  24 forward hop   Goal status: ONGOING   PLAN:  PT FREQUENCY: 1-2x/week  PT DURATION: 12 weeks PLANNED INTERVENTIONS: 97110-Therapeutic exercises, 97530- Therapeutic activity, 97112- Neuromuscular re-education, 97535- Self Care, 02859- Manual therapy, [phone]- Gait training, [phone]- Electrical stimulation (unattended), [phone]- Electrical stimulation (manual), Patient/Family education, Balance training, Stair training, Joint mobilization, Joint manipulation, Spinal manipulation, and Spinal mobilization  PLAN FOR NEXT SESSION: Bike to warm up. Re-eval 1 RM goals. Update home exercise plan with focus on single leg hops and strengthening exercises only. Focus on single leg ball toss into retractor and bosu ball squat ball throws. RDL with cable on RLE.    Toribio Servant PT, DPT  [facility] [address] [place], [address] Phone: [phone]   Fax:  [phone]

## 2024-07-16 ENCOUNTER — Ambulatory Visit: Admitting: Physical Therapy

## 2024-07-16 ENCOUNTER — Encounter: Payer: Self-pay | Admitting: Physical Therapy

## 2024-07-16 DIAGNOSIS — M25561 Pain in right knee: Secondary | ICD-10-CM | POA: Diagnosis not present

## 2024-07-16 DIAGNOSIS — R262 Difficulty in walking, not elsewhere classified: Secondary | ICD-10-CM

## 2024-07-16 DIAGNOSIS — M25661 Stiffness of right knee, not elsewhere classified: Secondary | ICD-10-CM

## 2024-07-16 NOTE — Therapy (Signed)
 OUTPATIENT PHYSICAL THERAPY TREATMENT NOTE   Patient Name: Gregory Banks. MRN: [id number] DOB:27-Feb-1966, 58 y.o., male Today's Date: 07/16/2024  END OF SESSION:  PT End of Session - 07/16/24 1400     Visit Number 26    Number of Visits 35    Date for Recertification  08/22/24    Authorization Type Medicare primary; [employer] commercial 2ndary    Authorization - Visit Number 26    Authorization - Number of Visits 35    Progress Note Due on Visit 30    PT Start Time 1300    PT Stop Time 1345    PT Time Calculation (min) 45 min    Activity Tolerance Patient tolerated treatment well;No increased pain    Behavior During Therapy WFL for tasks assessed/performed               Past Medical History:  Diagnosis Date   Arthritis    knees, rt shoulder   Hyperlipidemia    Hypertension    MI (myocardial infarction) [facility])    Past Surgical History:  Procedure Laterality Date   CARDIAC CATHETERIZATION     Angioplasty with stent placement x 2   COLONOSCOPY WITH PROPOFOL  N/A 06/01/2017   Procedure: COLONOSCOPY WITH PROPOFOL ;  Surgeon: Therisa Bi, MD;  Location: [facility] ENDOSCOPY;  Service: Gastroenterology;  Laterality: N/A;   KNEE ARTHROSCOPY WITH MENISCAL REPAIR Right 02/26/2024   Procedure: ARTHROSCOPY, KNEE, WITH MENISCUS REPAIR;  Surgeon: Genelle Standing, MD;  Location: [facility];  Service: Orthopedics;  Laterality: Right;  RIGHT KNEE ARTHROSCOPY WITH LATERAL MENISCUS REPAIR   OTHER SURGICAL HISTORY     2 Cardiac stents   TUMOR REMOVAL     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   Patient Active Problem List   Diagnosis Date Noted   Complex tear of lateral meniscus of right knee as current injury 02/26/2024   Hyperlipidemia    MI (myocardial infarction) (HCC)    Intracranial mass 01/27/2015   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   Anxiety 03/04/2014   Chronic coronary artery disease 03/04/2014   Depression 03/04/2014   Hypertension 03/04/2014   Shortness of breath 03/04/2014   PCP:  Epifanio Alm SQUIBB, MD  REFERRING PROVIDER: [name] Genelle, MD (orthopedics)  REFERRING DIAG: s/p Rt latera meniscus repair  THERAPY DIAG:  Acute pain of right knee  Stiffness of right knee, not elsewhere classified  Difficulty in walking, not elsewhere classified  Rationale for Evaluation and Treatment: Rehabilitation  ONSET DATE: 02/26/24  SUBJECTIVE:   SUBJECTIVE STATEMENT:   Pt reports that he feels soreness in the rest of his body but not in his knee.   PERTINENT HISTORY: Knee pain and locking following a day of playing 4 basketball games, meniscus injury seen. Pt underwent lateral meniscus repair with Dr. Genelle on 6/3, placed in locked in extension, WBAT. At evaluation current pain: 6/10; Best pain: 0/10 Worst Pain: 6/10, on average has been around 4 PAIN:  Are you having pain? 0/10   PRECAUTIONS: WBAT, locked brace at all times when walking.  WEIGHT BEARING RESTRICTIONS: WBAT  FALLS:  Has patient fallen in last 6 months? No  LIVING ENVIRONMENT: Lives with: Wife Lives in: house  Stairs: 17 stairs to 2nd floor bedroom, no entry stairs  Has following equipment at home: crutches up to 6'6.   OCCUPATION: Retired   PLOF: Coaches basketball   PATIENT GOALS: Full recovery   NEXT MD VISIT: June 2025   OBJECTIVE:  Note: Objective measures were completed at Evaluation unless  otherwise noted.  PATIENT SURVEYS:  LEFS: 20% 04/17/24: 78% (63/80). 06/26/24: 84% (67/80) EDEMA:  Unable to see, still ace wrapped  LOWER EXTREMITY ROM:  A/PROM Right eval Left eval Right   04/23/24  Right  04/28/24 Right  04/30/24 Right  05/06/24 Right  05/13/24   Hip flexion         Hip extension         Hip abduction         Hip adduction         Hip internal rotation         Hip external rotation         Knee flexion 65 degrees 125 deg  115/NT  117/NT  118/NT 123/NT 125 deg   Knee extension  5 degrees 0 deg    0/NT  0 deg   Knee internal rotation         Knee external rotation          Knee abduction         Knee adduction         Ankle dorsiflexion         Ankle plantarflexion         Ankle inversion         Ankle eversion          (Blank rows = not tested)      MMT   Right 05/13/2022 Left 05/13/2022  Hip flexion 4 4+  Hip extension    Hip abduction 4 4+  Hip adduction    Hip internal rotation    Hip external rotation    Knee flexion 4+ 4  Knee extension 4+ 4  Ankle dorsiflexion    Ankle plantarflexion    Ankle inversion    Ankle eversion     (Blank rows = not tested)    1RM TESTING (calculated from <4RM, 06/26/2024) Single leg press at [facility], seat position 4 (meant to be 5 but missed) with toe at top of plate:  R: 873.4# (calculated from 3RM 115#) L: 144# (calculated from 2RM 135#) LSI: 87.8%  Single knee extension at [facility] machine, seat position 5 R: 90.7# (calculated from 2RM 85#) L: 115# (actual 1RM) LSI: 78.9%   TODAY'S TREATMENT   07/16/24: THEREX   Matrix recumbent bicycle at 23 for 6 min and resistance at 2   1 RM Max Testing   Knee Ext R 100 lbs, Leg Press R  125 lbs   THERAC   Ladders  -Double Limb Forward Hop  x 2   -RLE single leg hop x 2     - Two feet per space x 2  -Two feet lateral step  x 2    RLE single leg hop over meter stick into double limb squat  1 x 10     RLE step down on 6 inch step into squat 1 x 10   RLE  step down on 6 inch step into squat and then double limb jump 1 x 10   RLE  step down on 6 inch step into squat and then double limb bound forward 1 x 10    NMR  Bosu ball stance  10 sec stand   Bosu ball squat  X 10   Bosu ball squat into push pass into standing trampoline with 4 lb medicine ball  x 10       07/15/24: THERAC   TM 3 min (2.0 mph), 2 min (4.0 mph) x 2  Lateral Step Down onto RLE from 8 inch step  1 x 10   -min VC to maintain balance on RLE  Skater Jumps over 3.5 inch obstacle while catching ball and throwing back 2 x 10     NMR Single Leg RLE as stance LLE  March with Med  [facility] Up 1 x 10  Single Leg RLE as stance LLE March with #10 KB Press Up 1 x 10  Single Leg RLE as stance LLE March with #10 KB Press Up in RLE 1 x 10 Skater Jumps over 3.5 inch obstacle  2 x 10      PATIENT EDUCATION:  Education details: Tissue healing timeline and explanation of car seat position and LE dermatomes  Person educated: Patient and spouse  Education method: collaborative learning, deliberate practice, positive reinforcement, explicit instruction, establish rules. Education comprehension: excellent  HOME EXERCISE PROGRAM: Do not print   Access Code: [facility] URL: [email or web address] Date: 07/09/2024 Prepared by: Toribio Servant  Program Notes Return to running program -4 min walk and 1 min run  3-4x , 2-3 days per week Power skip on RLE with Left arm swing though 3 sets of 10 reps 3-4 x per week  Forward  hops on RLE with Arm against wall for support  2 SETS OF 5 reps x 7 days per week    Exercises - Prone Quadriceps Stretch with Strap  - 1 x daily - 7 x weekly - 3 reps - 60 sec  hold - Seated Hip External Rotation Stretch  - 1 x daily - 7 x weekly - 3 reps - 60 sec  hold - Lateral Single Leg Lunge Jumps  - 1 x daily - 7 x weekly - 1 sets - 20 reps - Jump Lunge on Right   - 1 x daily - 7 x weekly - 10 reps - Single Leg Jump Rope  - 1 x daily - 7 x weekly - 1 sets - 20 reps - Step Off Platform with landing on one leg    - 1 x daily - 7 x weekly - 1 sets - 20 reps  ASSESSMENT:  CLINICAL IMPRESSION:   Pt continues to progress towards goals with some improvement in RLE strength as evidenced by slight improvement in RLE 1 Max strength. He continues to show limitations in single limb hop height likely due to RLE weakness especially with power component. PT continues to focus on plyometric training by incorporating single leg balance and power training on RLE to improve single leg hop height and landing stability. PT to refocus on training RLE for power with  fast concentric phase and slow eccentric phase. He will continue to benefit from skilled PT to return to hopping on single leg to return to physical activity and to maintain balance while walking over uneven terrain to keep from falling.      Decreased knowledge of condition, decreased use of DME, decreased mobility, difficulty walking, decreased strength, decreased ROM. ACTIVITY LIMITATIONS: Lifting, standing, walking, squatting, transfers, locomotion level PARTICIPATION LIMITATIONS: Cleaning, laundry, interpersonal relationships, driving, yardwork, community activity.  PERSONAL FACTORS: Age, behavior pattern, education, past/current experiences, transportation, profession  are also affecting patient's functional outcome.  REHAB POTENTIAL: Great  CLINICAL DECISION MAKING: Great EVALUATION COMPLEXITY: Low  GOALS: Goals reviewed with patient? yes  SHORT TERM GOALS: Target date: 04/05/24 Patient will report comprehension, confidence, and consistent compliance and of a simple home exercise program established to facilitate symptoms management and basic strengthening and/or segment mobility.  Baseline: issued at eval 03/12/24: Able to perform HEP independently  Goal status: ACHIEVED    2.  Patient to demonstrate improved score on self-report measure by >9% to indicate reduced self-reported disability and/or pain.    Baseline: LEFS: 20%   04/17/24: 78% Goal status: ACHIEVED  3.  Pt to demonstrate Rt knee RM 0-90 degrees  Baseline: eval: 5-65 deg 03/20/24: 0-83  04/02/24: 0-90 deg on RLE  Goal status: ACHIEVED   LONG TERM GOALS: Target date: 08/22/24  Pt to demonstrate ability to perform >1555ft without pain limitation, less than 2/10 increase in NPRS to improve ability to participate in IADL and social events.  Baseline: 1,200 ft 04/30/24: 1650 ft   Goal status: ACHIEVED     2.  Pt to demonstrate less than 10% strength deficit in between bilat knee extension and bilat ankle PF to prepare  for return to leisure and community activity.  Baseline: Leg Press 1 RM R/L 80/160  Knee Ext 1 RM R/L  55/115  Single Leg Calf Raise R/L partial heel raise/ full heel raise on LLE 05/20/24:  Leg Press 1 RM R/L 105/160  Knee Ext 1 RM R/L  65/115. 06/26/24: Leg press calculated 1RM  R/L 126.5/144, Knee Ext calculated 1RM R/L 90.7/115, ankle PF testing deferred. 07/16/24: Knee Ext R 100 lbs, Leg Press R  125 lbs   Goal status: IN PROGRESS  3.  Patient to demonstrate improved performance on initial transfers assessment AEB improved reps per time or time per perform desired reps and/or reduced seat height and/or use of hands in order to improve safety, tolerance, and independence in ADL performance.   Baseline: Goal not appropriate for measuring LE endurance for pt's age Goal status: discontinued  4.  Pt to demonstrate <10% difference between single leg hop bilaterally  Baseline: Not able to perform due to restrictions. 06/30/24: Single Leg Hop R/L 35/NT  07/09/24: Starting on RLE and landing on RLE  24 forward hop   Goal status: ONGOING   PLAN:  PT FREQUENCY: 1-2x/week  PT DURATION: 12 weeks PLANNED INTERVENTIONS: 97110-Therapeutic exercises, 97530- Therapeutic activity, 97112- Neuromuscular re-education, 97535- Self Care, 02859- Manual therapy, [phone]- Gait training, [phone]- Electrical stimulation (unattended), [phone]- Electrical stimulation (manual), Patient/Family education, Balance training, Stair training, Joint mobilization, Joint manipulation, Spinal manipulation, and Spinal mobilization  PLAN FOR NEXT SESSION: Bike to warm up. Step up with increased concentric portion on 10 inch step and slow eccentric phase.  RDL with cable on RLE or single leg sit to stand or single leg squat. Single leg heel raise with power focus.     Toribio Servant PT, DPT  [facility] [address] [place], [address] Phone: [phone]   Fax:  [phone]

## 2024-07-17 ENCOUNTER — Ambulatory Visit: Payer: Self-pay | Admitting: Physical Therapy

## 2024-07-17 ENCOUNTER — Encounter: Payer: Self-pay | Admitting: Physical Therapy

## 2024-07-17 DIAGNOSIS — M25561 Pain in right knee: Secondary | ICD-10-CM

## 2024-07-17 DIAGNOSIS — R262 Difficulty in walking, not elsewhere classified: Secondary | ICD-10-CM

## 2024-07-17 DIAGNOSIS — M25661 Stiffness of right knee, not elsewhere classified: Secondary | ICD-10-CM

## 2024-07-17 NOTE — Therapy (Signed)
 OUTPATIENT PHYSICAL THERAPY TREATMENT NOTE   Patient Name: Gregory Banks. MRN: [id number] DOB:December 29, 1965, 58 y.o., male Today's Date: 07/17/2024  END OF SESSION:  PT End of Session - 07/17/24 0952     Visit Number 27    Number of Visits 35    Date for Recertification  08/22/24    Authorization Type Medicare primary; [employer] commercial 2ndary    Authorization - Visit Number 27    Authorization - Number of Visits 35    Progress Note Due on Visit 30    PT Start Time 0950    PT Stop Time 1030    PT Time Calculation (min) 40 min    Activity Tolerance Patient tolerated treatment well;No increased pain    Behavior During Therapy WFL for tasks assessed/performed               Past Medical History:  Diagnosis Date   Arthritis    knees, rt shoulder   Hyperlipidemia    Hypertension    MI (myocardial infarction) [facility])    Past Surgical History:  Procedure Laterality Date   CARDIAC CATHETERIZATION     Angioplasty with stent placement x 2   COLONOSCOPY WITH PROPOFOL  N/A 06/01/2017   Procedure: COLONOSCOPY WITH PROPOFOL ;  Surgeon: Therisa Bi, MD;  Location: [facility] ENDOSCOPY;  Service: Gastroenterology;  Laterality: N/A;   KNEE ARTHROSCOPY WITH MENISCAL REPAIR Right 02/26/2024   Procedure: ARTHROSCOPY, KNEE, WITH MENISCUS REPAIR;  Surgeon: Genelle Standing, MD;  Location: [facility];  Service: Orthopedics;  Laterality: Right;  RIGHT KNEE ARTHROSCOPY WITH LATERAL MENISCUS REPAIR   OTHER SURGICAL HISTORY     2 Cardiac stents   TUMOR REMOVAL     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   Patient Active Problem List   Diagnosis Date Noted   Complex tear of lateral meniscus of right knee as current injury 02/26/2024   Hyperlipidemia    MI (myocardial infarction) (HCC)    Intracranial mass 01/27/2015   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   Anxiety 03/04/2014   Chronic coronary artery disease 03/04/2014   Depression 03/04/2014   Hypertension 03/04/2014   Shortness of breath 03/04/2014   PCP:  Epifanio Alm SQUIBB, MD  REFERRING PROVIDER: [name] Genelle, MD (orthopedics)  REFERRING DIAG: s/p Rt latera meniscus repair  THERAPY DIAG:  Acute pain of right knee  Stiffness of right knee, not elsewhere classified  Difficulty in walking, not elsewhere classified  Rationale for Evaluation and Treatment: Rehabilitation  ONSET DATE: 02/26/24  SUBJECTIVE:   SUBJECTIVE STATEMENT:   Pt continues to feel muscle soreness but not in his knee.     PERTINENT HISTORY: Knee pain and locking following a day of playing 4 basketball games, meniscus injury seen. Pt underwent lateral meniscus repair with Dr. Genelle on 6/3, placed in locked in extension, WBAT. At evaluation current pain: 6/10; Best pain: 0/10 Worst Pain: 6/10, on average has been around 4 PAIN:  Are you having pain? 0/10   PRECAUTIONS: WBAT, locked brace at all times when walking.  WEIGHT BEARING RESTRICTIONS: WBAT  FALLS:  Has patient fallen in last 6 months? No  LIVING ENVIRONMENT: Lives with: Wife Lives in: house  Stairs: 17 stairs to 2nd floor bedroom, no entry stairs  Has following equipment at home: crutches up to 6'6.   OCCUPATION: Retired   PLOF: Coaches basketball   PATIENT GOALS: Full recovery   NEXT MD VISIT: June 2025   OBJECTIVE:  Note: Objective measures were completed at Evaluation unless otherwise noted.  PATIENT  SURVEYS:  LEFS: 20% 04/17/24: 78% (63/80). 06/26/24: 84% (67/80) EDEMA:  Unable to see, still ace wrapped  LOWER EXTREMITY ROM:  A/PROM Right eval Left eval Right   04/23/24  Right  04/28/24 Right  04/30/24 Right  05/06/24 Right  05/13/24   Hip flexion         Hip extension         Hip abduction         Hip adduction         Hip internal rotation         Hip external rotation         Knee flexion 65 degrees 125 deg  115/NT  117/NT  118/NT 123/NT 125 deg   Knee extension  5 degrees 0 deg    0/NT  0 deg   Knee internal rotation         Knee external rotation         Knee abduction          Knee adduction         Ankle dorsiflexion         Ankle plantarflexion         Ankle inversion         Ankle eversion          (Blank rows = not tested)      MMT   Right 05/13/2022 Left 05/13/2022  Hip flexion 4 4+  Hip extension    Hip abduction 4 4+  Hip adduction    Hip internal rotation    Hip external rotation    Knee flexion 4+ 4  Knee extension 4+ 4  Ankle dorsiflexion    Ankle plantarflexion    Ankle inversion    Ankle eversion     (Blank rows = not tested)    1RM TESTING (calculated from <4RM, 06/26/2024) Single leg press at [facility], seat position 4 (meant to be 5 but missed) with toe at top of plate:  R: 873.4# (calculated from 3RM 115#) L: 144# (calculated from 2RM 135#) LSI: 87.8%  Single knee extension at [facility] machine, seat position 5 R: 90.7# (calculated from 2RM 85#) L: 115# (actual 1RM) LSI: 78.9%   TODAY'S TREATMENT   07/17/24: THEREX  Matrix Recumbent Bike with seat at 23 for 5 min   Sumo Squat with #40 lb KB 1 x 10 with taps on 11 inch platform   -min VC to keep ankles externally rotated and to keep upright     Sumo Squat with #40 lb KB 1 x 10  with taps on 6 inch platform    THERAC   Runner Step Up on 12 inch platform 2 x 10  -min VC for fast concentric and slow eccentric    Single Leg RDL on RLE into power skip press up 2 x 10  -min VC to decrease speed of eccentric phase and increase speed of concentric phase  RLE single leg heel raise 1 x 15 -Pt shows decreased height to calf raise  RLE single leg heel raise with LLE on 11 inch step 1 X 10   -Improved heel raise height but still decreased   RLE single leg heel raise with LLE on 6 inch step 1 x 10  -Near symmetrical height to heel raise    07/16/24: THEREX   Matrix recumbent bicycle at 23 for 6 min and resistance at 2   1 RM Max Testing   Knee Ext R 100 lbs, Leg Press  R  125 lbs   THERAC   Ladders  -Double Limb Forward Hop  x 2   -RLE single leg hop x 2     - Two feet  per space x 2  -Two feet lateral step  x 2    RLE single leg hop over meter stick into double limb squat  1 x 10     RLE step down on 6 inch step into squat 1 x 10   RLE  step down on 6 inch step into squat and then double limb jump 1 x 10   RLE  step down on 6 inch step into squat and then double limb bound forward 1 x 10    NMR  Bosu ball stance  10 sec stand   Bosu ball squat  X 10   Bosu ball squat into push pass into standing trampoline with 4 lb medicine ball  x 10       07/15/24: THERAC   TM 3 min (2.0 mph), 2 min (4.0 mph) x 2  Lateral Step Down onto RLE from 8 inch step  1 x 10   -min VC to maintain balance on RLE  Skater Jumps over 3.5 inch obstacle while catching ball and throwing back 2 x 10     NMR Single Leg RLE as stance LLE  March with [facility] Up 1 x 10  Single Leg RLE as stance LLE March with #10 KB Press Up 1 x 10  Single Leg RLE as stance LLE March with #10 KB Press Up in RLE 1 x 10 Skater Jumps over 3.5 inch obstacle  2 x 10      PATIENT EDUCATION:  Education details: Tissue healing timeline and explanation of car seat position and LE dermatomes  Person educated: Patient and spouse  Education method: Research scientist (medical), deliberate practice, positive reinforcement, explicit instruction, establish rules. Education comprehension: excellent  HOME EXERCISE PROGRAM: Do not print   Access Code: [facility] URL: [email or web address] Date: 07/09/2024 Prepared by: Toribio Servant  Program Notes Return to running program -4 min walk and 1 min run  3-4x , 2-3 days per week Power skip on RLE with Left arm swing though 3 sets of 10 reps 3-4 x per week  Forward  hops on RLE with Arm against wall for support  2 SETS OF 5 reps x 7 days per week    Exercises - Prone Quadriceps Stretch with Strap  - 1 x daily - 7 x weekly - 3 reps - 60 sec  hold - Seated Hip External Rotation Stretch  - 1 x daily - 7 x weekly - 3 reps - 60 sec  hold - Lateral  Single Leg Lunge Jumps  - 1 x daily - 7 x weekly - 1 sets - 20 reps - Jump Lunge on Right   - 1 x daily - 7 x weekly - 10 reps - Single Leg Jump Rope  - 1 x daily - 7 x weekly - 1 sets - 20 reps - Step Off Platform with landing on one leg    - 1 x daily - 7 x weekly - 1 sets - 20 reps  ASSESSMENT:  CLINICAL IMPRESSION:   Pt continues to show decreased concentric explosiveness with calf raises and skip jumps. PT focused on strengthening and balance exercsises to improve single leg strength and explosiveness for improved foot clearance on RLE. He will continue to benefit from skilled PT to return to hopping on  single leg to return to physical activity and to maintain balance while walking over uneven terrain to keep from falling.      Decreased knowledge of condition, decreased use of DME, decreased mobility, difficulty walking, decreased strength, decreased ROM. ACTIVITY LIMITATIONS: Lifting, standing, walking, squatting, transfers, locomotion level PARTICIPATION LIMITATIONS: Cleaning, laundry, interpersonal relationships, driving, yardwork, community activity.  PERSONAL FACTORS: Age, behavior pattern, education, past/current experiences, transportation, profession  are also affecting patient's functional outcome.  REHAB POTENTIAL: Great  CLINICAL DECISION MAKING: Great EVALUATION COMPLEXITY: Low  GOALS: Goals reviewed with patient? yes  SHORT TERM GOALS: Target date: 04/05/24 Patient will report comprehension, confidence, and consistent compliance and of a simple home exercise program established to facilitate symptoms management and basic strengthening and/or segment mobility.  Baseline: issued at eval 03/12/24: Able to perform HEP independently  Goal status: ACHIEVED    2.  Patient to demonstrate improved score on self-report measure by >9% to indicate reduced self-reported disability and/or pain.    Baseline: LEFS: 20%   04/17/24: 78% Goal status: ACHIEVED  3.  Pt to demonstrate Rt  knee RM 0-90 degrees  Baseline: eval: 5-65 deg 03/20/24: 0-83  04/02/24: 0-90 deg on RLE  Goal status: ACHIEVED   LONG TERM GOALS: Target date: 08/22/24  Pt to demonstrate ability to perform >1561ft without pain limitation, less than 2/10 increase in NPRS to improve ability to participate in IADL and social events.  Baseline: 1,200 ft 04/30/24: 1650 ft   Goal status: ACHIEVED     2.  Pt to demonstrate less than 10% strength deficit in between bilat knee extension and bilat ankle PF to prepare for return to leisure and community activity.  Baseline: Leg Press 1 RM R/L 80/160  Knee Ext 1 RM R/L  55/115  Single Leg Calf Raise R/L partial heel raise/ full heel raise on LLE 05/20/24:  Leg Press 1 RM R/L 105/160  Knee Ext 1 RM R/L  65/115. 06/26/24: Leg press calculated 1RM  R/L 126.5/144, Knee Ext calculated 1RM R/L 90.7/115, ankle PF testing deferred. 07/16/24: Knee Ext R 100 lbs, Leg Press R  125 lbs   Goal status: IN PROGRESS  3.  Patient to demonstrate improved performance on initial transfers assessment AEB improved reps per time or time per perform desired reps and/or reduced seat height and/or use of hands in order to improve safety, tolerance, and independence in ADL performance.   Baseline: Goal not appropriate for measuring LE endurance for pt's age Goal status: discontinued  4.  Pt to demonstrate <10% difference between single leg hop bilaterally  Baseline: Not able to perform due to restrictions. 06/30/24: Single Leg Hop R/L 35/NT  07/09/24: Starting on RLE and landing on RLE  24 forward hop   Goal status: ONGOING   PLAN:  PT FREQUENCY: 1-2x/week  PT DURATION: 12 weeks PLANNED INTERVENTIONS: 97110-Therapeutic exercises, 97530- Therapeutic activity, 97112- Neuromuscular re-education, 97535- Self Care, 02859- Manual therapy, [phone]- Gait training, [phone]- Electrical stimulation (unattended), [phone]- Electrical stimulation (manual), Patient/Family education, Balance training, Stair training,  Joint mobilization, Joint manipulation, Spinal manipulation, and Spinal mobilization  PLAN FOR NEXT SESSION: Bike to warm up. Progress return to run program 2 min walk and 3 min walk. Progress calf and right hip power exercises. Update home exercise program.     Toribio Servant PT, DPT  [facility] [address] [place], [address] Phone: [phone]   Fax:  [phone]

## 2024-07-21 ENCOUNTER — Ambulatory Visit: Admitting: Physical Therapy

## 2024-07-21 ENCOUNTER — Encounter: Payer: Self-pay | Admitting: Physical Therapy

## 2024-07-21 DIAGNOSIS — M25661 Stiffness of right knee, not elsewhere classified: Secondary | ICD-10-CM

## 2024-07-21 DIAGNOSIS — M25561 Pain in right knee: Secondary | ICD-10-CM | POA: Diagnosis not present

## 2024-07-21 DIAGNOSIS — R262 Difficulty in walking, not elsewhere classified: Secondary | ICD-10-CM

## 2024-07-21 NOTE — Therapy (Signed)
 OUTPATIENT PHYSICAL THERAPY TREATMENT NOTE   Patient Name: Gregory Banks. MRN: [id number] DOB:21-Jan-1966, 58 y.o., male Today's Date: 07/21/2024  END OF SESSION:  PT End of Session - 07/21/24 1353     Visit Number 28    Number of Visits 35    Date for Recertification  08/22/24    Authorization Type Medicare primary; [employer] commercial 2ndary    Authorization - Visit Number 28    Authorization - Number of Visits 35    Progress Note Due on Visit 30    PT Start Time 1345    PT Stop Time 1430    PT Time Calculation (min) 45 min    Activity Tolerance Patient tolerated treatment well;No increased pain    Behavior During Therapy WFL for tasks assessed/performed                Past Medical History:  Diagnosis Date   Arthritis    knees, rt shoulder   Hyperlipidemia    Hypertension    MI (myocardial infarction) [facility])    Past Surgical History:  Procedure Laterality Date   CARDIAC CATHETERIZATION     Angioplasty with stent placement x 2   COLONOSCOPY WITH PROPOFOL  N/A 06/01/2017   Procedure: COLONOSCOPY WITH PROPOFOL ;  Surgeon: Therisa Bi, MD;  Location: [facility] ENDOSCOPY;  Service: Gastroenterology;  Laterality: N/A;   KNEE ARTHROSCOPY WITH MENISCAL REPAIR Right 02/26/2024   Procedure: ARTHROSCOPY, KNEE, WITH MENISCUS REPAIR;  Surgeon: Genelle Standing, MD;  Location:  SURGERY CENTER;  Service: Orthopedics;  Laterality: Right;  RIGHT KNEE ARTHROSCOPY WITH LATERAL MENISCUS REPAIR   OTHER SURGICAL HISTORY     2 Cardiac stents   TUMOR REMOVAL     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   Patient Active Problem List   Diagnosis Date Noted   Complex tear of lateral meniscus of right knee as current injury 02/26/2024   Hyperlipidemia    MI (myocardial infarction) (HCC)    Intracranial mass 01/27/2015   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   Anxiety 03/04/2014   Chronic coronary artery disease 03/04/2014   Depression 03/04/2014   Hypertension 03/04/2014   Shortness of breath 03/04/2014    PCP: Epifanio Alm SQUIBB, MD  REFERRING PROVIDER: [name] Genelle, MD (orthopedics)  REFERRING DIAG: s/p Rt latera meniscus repair  THERAPY DIAG:  Acute pain of right knee  Stiffness of right knee, not elsewhere classified  Difficulty in walking, not elsewhere classified  Rationale for Evaluation and Treatment: Rehabilitation  ONSET DATE: 02/26/24  SUBJECTIVE:   SUBJECTIVE STATEMENT:   He felt soreness over the weekend but overall right knee is feeling ok.    PERTINENT HISTORY: Knee pain and locking following a day of playing 4 basketball games, meniscus injury seen. Pt underwent lateral meniscus repair with Dr. Genelle on 6/3, placed in locked in extension, WBAT. At evaluation current pain: 6/10; Best pain: 0/10 Worst Pain: 6/10, on average has been around 4 PAIN:  Are you having pain? 0/10   PRECAUTIONS: WBAT, locked brace at all times when walking.  WEIGHT BEARING RESTRICTIONS: WBAT  FALLS:  Has patient fallen in last 6 months? No  LIVING ENVIRONMENT: Lives with: Wife Lives in: house  Stairs: 17 stairs to 2nd floor bedroom, no entry stairs  Has following equipment at home: crutches up to 6'6.   OCCUPATION: Retired   PLOF: Coaches basketball   PATIENT GOALS: Full recovery   NEXT MD VISIT: June 2025   OBJECTIVE:  Note: Objective measures were completed at Evaluation unless otherwise noted.  PATIENT SURVEYS:  LEFS: 20% 04/17/24: 78% (63/80). 06/26/24: 84% (67/80) EDEMA:  Unable to see, still ace wrapped  LOWER EXTREMITY ROM:  A/PROM Right eval Left eval Right   04/23/24  Right  04/28/24 Right  04/30/24 Right  05/06/24 Right  05/13/24   Hip flexion         Hip extension         Hip abduction         Hip adduction         Hip internal rotation         Hip external rotation         Knee flexion 65 degrees 125 deg  115/NT  117/NT  118/NT 123/NT 125 deg   Knee extension  5 degrees 0 deg    0/NT  0 deg   Knee internal rotation         Knee external rotation          Knee abduction         Knee adduction         Ankle dorsiflexion         Ankle plantarflexion         Ankle inversion         Ankle eversion          (Blank rows = not tested)      MMT   Right 05/13/2022 Left 05/13/2022  Hip flexion 4 4+  Hip extension    Hip abduction 4 4+  Hip adduction    Hip internal rotation    Hip external rotation    Knee flexion 4+ 4  Knee extension 4+ 4  Ankle dorsiflexion    Ankle plantarflexion    Ankle inversion    Ankle eversion     (Blank rows = not tested)    1RM TESTING (calculated from <4RM, 06/26/2024) Single leg press at [facility], seat position 4 (meant to be 5 but missed) with toe at top of plate:  R: 873.4# (calculated from 3RM 115#) L: 144# (calculated from 2RM 135#) LSI: 87.8%  Single knee extension at [facility] machine, seat position 5 R: 90.7# (calculated from 2RM 85#) L: 115# (actual 1RM) LSI: 78.9%   TODAY'S TREATMENT   07/21/24:   THEREX Dynamic warm up  -Single knee to chest alternating 20 ft  -Quad Stretch alternating 20 ft  -Forward Lunges alternating  20 ft   -Soldier Walks   20 ft    Seated HS Stretch with strap with heel propped on 10 inch step on RLE  3 x 60 sec    Elevated plank from 18 inch mat height 30 sec   THERAC   1 min walk (2.5 mph) and 1 min run (4 mph)  x 7= 14 min    Single Leg Squat into Single Leg Heel Raise with 1 UE support on RLE  3 x 10        PATIENT EDUCATION:  Education details: Tissue healing timeline and explanation of car seat position and LE dermatomes  Person educated: Patient and spouse  Education method: research scientist (medical), deliberate practice, positive reinforcement, explicit instruction, establish rules. Education comprehension: excellent  HOME EXERCISE PROGRAM: Do not print   Access Code: [facility] URL: [email or web address] Date: 07/21/2024 Prepared by: Toribio Servant  Program Notes Return to running program - walk and 1 min run  x 15 , 3-4   days per week Single Leg Heel Raise on Right with left on step 3 x 20  X 5-7 days per week  Power skip on RLE with Left arm swing though 3 sets of 10 reps 3-4 x per week    Exercises - Prone Quadriceps Stretch with Strap  - 1 x daily - 7 x weekly - 3 reps - 60 sec  hold - Seated Hip External Rotation Stretch  - 1 x daily - 7 x weekly - 3 reps - 60 sec  hold - Lateral Single Leg Lunge Jumps  - 1 x daily - 7 x weekly - 3 sets - 10 reps - Jump Lunge on Right   - 1 x daily - 7 x weekly - 10 reps - Single Leg Jump Rope  - 1 x daily - 7 x weekly - 1 sets - 20 reps - Romanian dead lift to coin jump with tap on 11 inch step   - 3-4 x weekly - 3 sets - 10 reps - Sumo Squat with Dumbbell  - 3-4 x weekly - 3 sets - 10 reps - Forearm to Full Planks  - 1 x daily - 7 x weekly - 5 reps - 30 sec holdps  ASSESSMENT:  CLINICAL IMPRESSION:   Pt shows ongoing improvement in aerobic endurance and ability to run longer distances. PT focused on therapeutic activity throughout session with focus combined strengthening and balance exercises to promote single leg explosiveness and stability be able to tolerate increased load with running. He will continue to benefit from skilled PT to return to hopping on single leg to return to physical activity and to maintain balance while walking over uneven terrain to keep from falling.     Decreased knowledge of condition, decreased use of DME, decreased mobility, difficulty walking, decreased strength, decreased ROM. ACTIVITY LIMITATIONS: Lifting, standing, walking, squatting, transfers, locomotion level PARTICIPATION LIMITATIONS: Cleaning, laundry, interpersonal relationships, driving, yardwork, community activity.  PERSONAL FACTORS: Age, behavior pattern, education, past/current experiences, transportation, profession  are also affecting patient's functional outcome.  REHAB POTENTIAL: Great  CLINICAL DECISION MAKING: Great EVALUATION COMPLEXITY: Low  GOALS: Goals reviewed  with patient? yes  SHORT TERM GOALS: Target date: 04/05/24 Patient will report comprehension, confidence, and consistent compliance and of a simple home exercise program established to facilitate symptoms management and basic strengthening and/or segment mobility.  Baseline: issued at eval 03/12/24: Able to perform HEP independently  Goal status: ACHIEVED    2.  Patient to demonstrate improved score on self-report measure by >9% to indicate reduced self-reported disability and/or pain.    Baseline: LEFS: 20%   04/17/24: 78% Goal status: ACHIEVED  3.  Pt to demonstrate Rt knee RM 0-90 degrees  Baseline: eval: 5-65 deg 03/20/24: 0-83  04/02/24: 0-90 deg on RLE  Goal status: ACHIEVED   LONG TERM GOALS: Target date: 08/22/24  Pt to demonstrate ability to perform >1527ft without pain limitation, less than 2/10 increase in NPRS to improve ability to participate in IADL and social events.  Baseline: 1,200 ft 04/30/24: 1650 ft   Goal status: ACHIEVED     2.  Pt to demonstrate less than 10% strength deficit in between bilat knee extension and bilat ankle PF to prepare for return to leisure and community activity.  Baseline: Leg Press 1 RM R/L 80/160  Knee Ext 1 RM R/L  55/115  Single Leg Calf Raise R/L partial heel raise/ full heel raise on LLE 05/20/24:  Leg Press 1 RM R/L 105/160  Knee Ext 1 RM R/L  65/115. 06/26/24: Leg press calculated 1RM  R/L 126.5/144, Knee  Ext calculated 1RM R/L 90.7/115, ankle PF testing deferred. 07/16/24: Knee Ext R 100 lbs, Leg Press R  125 lbs   Goal status: IN PROGRESS  3.  Patient to demonstrate improved performance on initial transfers assessment AEB improved reps per time or time per perform desired reps and/or reduced seat height and/or use of hands in order to improve safety, tolerance, and independence in ADL performance.   Baseline: Goal not appropriate for measuring LE endurance for pt's age Goal status: discontinued  4.  Pt to demonstrate <10% difference between  single leg hop bilaterally  Baseline: Not able to perform due to restrictions. 06/30/24: Single Leg Hop R/L 35/NT  07/09/24: Starting on RLE and landing on RLE  24 forward hop   Goal status: ONGOING   PLAN:  PT FREQUENCY: 1-2x/week  PT DURATION: 12 weeks PLANNED INTERVENTIONS: 97110-Therapeutic exercises, 97530- Therapeutic activity, 97112- Neuromuscular re-education, 97535- Self Care, 02859- Manual therapy, [phone]- Gait training, [phone]- Electrical stimulation (unattended), [phone]- Electrical stimulation (manual), Patient/Family education, Balance training, Stair training, Joint mobilization, Joint manipulation, Spinal manipulation, and Spinal mobilization  PLAN FOR NEXT SESSION: Bike to warm up. 2-3 min run and 2 min walk x  5. Do level 2 drillls from CU return to running protocol: lateral shuffles, grapevines, boxer shuffles, backward shuffle, lateral skip, tap skip     3 x 50 ft for all       Toribio Servant PT, DPT  [facility] [address] [place], [address] Phone: [phone]   Fax:  [phone]

## 2024-07-22 ENCOUNTER — Encounter: Payer: Self-pay | Admitting: Physical Therapy

## 2024-07-23 ENCOUNTER — Ambulatory Visit: Admitting: Physical Therapy

## 2024-07-23 ENCOUNTER — Ambulatory Visit: Payer: Self-pay | Attending: Orthopaedic Surgery

## 2024-07-23 ENCOUNTER — Encounter: Payer: Self-pay | Admitting: Physical Therapy

## 2024-07-23 DIAGNOSIS — M25561 Pain in right knee: Secondary | ICD-10-CM | POA: Insufficient documentation

## 2024-07-23 DIAGNOSIS — M25661 Stiffness of right knee, not elsewhere classified: Secondary | ICD-10-CM | POA: Insufficient documentation

## 2024-07-23 DIAGNOSIS — R262 Difficulty in walking, not elsewhere classified: Secondary | ICD-10-CM | POA: Insufficient documentation

## 2024-07-23 NOTE — Therapy (Signed)
 OUTPATIENT PHYSICAL THERAPY TREATMENT NOTE   Patient Name: Gregory Banks. MRN: [id number] DOB:[birth year], 58 y.o., male Today's Date: 07/23/2024  END OF SESSION:  PT End of Session - 07/23/24 1435     Visit Number 29    Number of Visits 35    Date for Recertification  08/22/24    Authorization Type Medicare primary; [employer] commercial 2ndary    Authorization - Number of Visits 35    Progress Note Due on Visit 30    PT Start Time 1430    PT Stop Time 1512    PT Time Calculation (min) 42 min    Activity Tolerance Patient tolerated treatment well;No increased pain    Behavior During Therapy WFL for tasks assessed/performed                 Past Medical History:  Diagnosis Date   Arthritis    knees, rt shoulder   Hyperlipidemia    Hypertension    MI (myocardial infarction) [facility])    Past Surgical History:  Procedure Laterality Date   CARDIAC CATHETERIZATION     Angioplasty with stent placement x 2   COLONOSCOPY WITH PROPOFOL  N/A 06/01/2017   Procedure: COLONOSCOPY WITH PROPOFOL ;  Surgeon: Therisa Bi, MD;  Location: [facility] ENDOSCOPY;  Service: Gastroenterology;  Laterality: N/A;   KNEE ARTHROSCOPY WITH MENISCAL REPAIR Right 02/26/2024   Procedure: ARTHROSCOPY, KNEE, WITH MENISCUS REPAIR;  Surgeon: Genelle Standing, MD;  Location: [facility];  Service: Orthopedics;  Laterality: Right;  RIGHT KNEE ARTHROSCOPY WITH LATERAL MENISCUS REPAIR   OTHER SURGICAL HISTORY     2 Cardiac stents   TUMOR REMOVAL     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   Patient Active Problem List   Diagnosis Date Noted   Complex tear of lateral meniscus of right knee as current injury 02/26/2024   Hyperlipidemia    MI (myocardial infarction) (HCC)    Intracranial mass 01/27/2015   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   Anxiety 03/04/2014   Chronic coronary artery disease 03/04/2014   Depression 03/04/2014   Hypertension 03/04/2014   Shortness of breath 03/04/2014   PCP: Epifanio Alm SQUIBB, MD   REFERRING PROVIDER: [name] Genelle, MD (orthopedics)  REFERRING DIAG: s/p Rt latera meniscus repair  THERAPY DIAG:  Acute pain of right knee  Stiffness of right knee, not elsewhere classified  Difficulty in walking, not elsewhere classified  Rationale for Evaluation and Treatment: Rehabilitation  ONSET DATE: 02/26/24  SUBJECTIVE:   SUBJECTIVE STATEMENT:   Denies pain  PERTINENT HISTORY: Knee pain and locking following a day of playing 4 basketball games, meniscus injury seen. Pt underwent lateral meniscus repair with Dr. Genelle on 6/3, placed in locked in extension, WBAT. At evaluation current pain: 6/10; Best pain: 0/10 Worst Pain: 6/10, on average has been around 4 PAIN:  Are you having pain? 0/10   PRECAUTIONS: WBAT, locked brace at all times when walking.  WEIGHT BEARING RESTRICTIONS: WBAT  FALLS:  Has patient fallen in last 6 months? No  LIVING ENVIRONMENT: Lives with: Wife Lives in: house  Stairs: 17 stairs to 2nd floor bedroom, no entry stairs  Has following equipment at home: crutches up to 6'6.   OCCUPATION: Retired   PLOF: Coaches basketball   PATIENT GOALS: Full recovery   NEXT MD VISIT: June 2025   OBJECTIVE:  Note: Objective measures were completed at Evaluation unless otherwise noted.  PATIENT SURVEYS:  LEFS: 20% 04/17/24: 78% (63/80). 06/26/24: 84% (67/80) EDEMA:  Unable to see, still ace wrapped  LOWER EXTREMITY ROM:  A/PROM Right eval Left eval Right   04/23/24  Right  04/28/24 Right  04/30/24 Right  05/06/24 Right  05/13/24   Hip flexion         Hip extension         Hip abduction         Hip adduction         Hip internal rotation         Hip external rotation         Knee flexion 65 degrees 125 deg  115/NT  117/NT  118/NT 123/NT 125 deg   Knee extension  5 degrees 0 deg    0/NT  0 deg   Knee internal rotation         Knee external rotation         Knee abduction         Knee adduction         Ankle dorsiflexion         Ankle  plantarflexion         Ankle inversion         Ankle eversion          (Blank rows = not tested)      MMT   Right 05/13/2022 Left 05/13/2022  Hip flexion 4 4+  Hip extension    Hip abduction 4 4+  Hip adduction    Hip internal rotation    Hip external rotation    Knee flexion 4+ 4  Knee extension 4+ 4  Ankle dorsiflexion    Ankle plantarflexion    Ankle inversion    Ankle eversion     (Blank rows = not tested)    1RM TESTING (calculated from <4RM, 06/26/2024) Single leg press at [facility], seat position 4 (meant to be 5 but missed) with toe at top of plate:  R: 873.4# (calculated from 3RM 115#) L: 144# (calculated from 2RM 135#) LSI: 87.8%  Single knee extension at [facility] machine, seat position 5 R: 90.7# (calculated from 2RM 85#) L: 115# (actual 1RM) LSI: 78.9%   TODAY'S TREATMENT   07/23/24:  TA:  1 min walk (3.0 mph) and 1 min run (4.5 mph)  x 7= 14 min    Lateral leaping 2 x 1 minute  Forward walking lunges 2 x 10 m - alternating  Soldier walks 2 x 20'  Single knee to chest alternating 20'  Sidestepping with BTB around knees 3 x 15' each direction  Quad stretch alternating 20'   07/21/24:   THEREX Dynamic warm up  -Single knee to chest alternating 20 ft  -Quad Stretch alternating 20 ft  -Forward Lunges alternating  20 ft   -Soldier Walks   20 ft    Seated HS Stretch with strap with heel propped on 10 inch step on RLE  3 x 60 sec    Elevated plank from 18 inch mat height 30 sec   THERAC   1 min walk (2.8 mph) and 1 min run (4.5 mph)  x 7= 14 min    Single Leg Squat into Single Leg Heel Raise with 1 UE support on RLE  3 x 10     PATIENT EDUCATION:  Education details: Tissue healing timeline and explanation of car seat position and LE dermatomes  Person educated: Patient and spouse  Education method: research scientist (medical), deliberate practice, positive reinforcement, explicit instruction, establish rules. Education comprehension: excellent  HOME  EXERCISE PROGRAM: Do not print  Access Code: [facility] URL: [email or web address] Date: 07/21/2024 Prepared by: Toribio Servant  Program Notes Return to running program - walk and 1 min run  x 15 , 3-4  days per week Single Leg Heel Raise on Right with left on step 3 x 20 X 5-7 days per week  Power skip on RLE with Left arm swing though 3 sets of 10 reps 3-4 x per week    Exercises - Prone Quadriceps Stretch with Strap  - 1 x daily - 7 x weekly - 3 reps - 60 sec  hold - Seated Hip External Rotation Stretch  - 1 x daily - 7 x weekly - 3 reps - 60 sec  hold - Lateral Single Leg Lunge Jumps  - 1 x daily - 7 x weekly - 3 sets - 10 reps - Jump Lunge on Right   - 1 x daily - 7 x weekly - 10 reps - Single Leg Jump Rope  - 1 x daily - 7 x weekly - 1 sets - 20 reps - Romanian dead lift to coin jump with tap on 11 inch step   - 3-4 x weekly - 3 sets - 10 reps - Sumo Squat with Dumbbell  - 3-4 x weekly - 3 sets - 10 reps - Forearm to Full Planks  - 1 x daily - 7 x weekly - 5 reps - 30 sec holdps  ASSESSMENT:  CLINICAL IMPRESSION:    Pt shows ongoing improvement in aerobic endurance and ability to run longer distances. PT focused on therapeutic activity throughout session with focus BLE strengthening and stretching. Tolerated well with appropriate fatigue. He will continue to benefit from skilled PT to return to hopping on single leg to return to physical activity and to maintain balance while walking over uneven terrain to keep from falling.     Decreased knowledge of condition, decreased use of DME, decreased mobility, difficulty walking, decreased strength, decreased ROM. ACTIVITY LIMITATIONS: Lifting, standing, walking, squatting, transfers, locomotion level PARTICIPATION LIMITATIONS: Cleaning, laundry, interpersonal relationships, driving, yardwork, community activity.  PERSONAL FACTORS: Age, behavior pattern, education, past/current experiences, transportation, profession  are  also affecting patient's functional outcome.  REHAB POTENTIAL: Great  CLINICAL DECISION MAKING: Great EVALUATION COMPLEXITY: Low  GOALS: Goals reviewed with patient? yes  SHORT TERM GOALS: Target date: 04/05/24 Patient will report comprehension, confidence, and consistent compliance and of a simple home exercise program established to facilitate symptoms management and basic strengthening and/or segment mobility.  Baseline: issued at eval 03/12/24: Able to perform HEP independently  Goal status: ACHIEVED    2.  Patient to demonstrate improved score on self-report measure by >9% to indicate reduced self-reported disability and/or pain.    Baseline: LEFS: 20%   04/17/24: 78% Goal status: ACHIEVED  3.  Pt to demonstrate Rt knee RM 0-90 degrees  Baseline: eval: 5-65 deg 03/20/24: 0-83  04/02/24: 0-90 deg on RLE  Goal status: ACHIEVED   LONG TERM GOALS: Target date: 08/22/24  Pt to demonstrate ability to perform >1544ft without pain limitation, less than 2/10 increase in NPRS to improve ability to participate in IADL and social events.  Baseline: 1,200 ft 04/30/24: 1650 ft   Goal status: ACHIEVED     2.  Pt to demonstrate less than 10% strength deficit in between bilat knee extension and bilat ankle PF to prepare for return to leisure and community activity.  Baseline: Leg Press 1 RM R/L 80/160  Knee Ext 1 RM R/L  55/115  Single  Leg Calf Raise R/L partial heel raise/ full heel raise on LLE 05/20/24:  Leg Press 1 RM R/L 105/160  Knee Ext 1 RM R/L  65/115. 06/26/24: Leg press calculated 1RM  R/L 126.5/144, Knee Ext calculated 1RM R/L 90.7/115, ankle PF testing deferred. 07/16/24: Knee Ext R 100 lbs, Leg Press R  125 lbs   Goal status: IN PROGRESS  3.  Patient to demonstrate improved performance on initial transfers assessment AEB improved reps per time or time per perform desired reps and/or reduced seat height and/or use of hands in order to improve safety, tolerance, and independence in ADL  performance.   Baseline: Goal not appropriate for measuring LE endurance for pt's age Goal status: discontinued  4.  Pt to demonstrate <10% difference between single leg hop bilaterally  Baseline: Not able to perform due to restrictions. 06/30/24: Single Leg Hop R/L 35/NT  07/09/24: Starting on RLE and landing on RLE  24 forward hop   Goal status: ONGOING   PLAN:  PT FREQUENCY: 1-2x/week  PT DURATION: 12 weeks PLANNED INTERVENTIONS: 97110-Therapeutic exercises, 97530- Therapeutic activity, 97112- Neuromuscular re-education, 97535- Self Care, 02859- Manual therapy, [phone]- Gait training, [phone]- Electrical stimulation (unattended), [phone]- Electrical stimulation (manual), Patient/Family education, Balance training, Stair training, Joint mobilization, Joint manipulation, Spinal manipulation, and Spinal mobilization  PLAN FOR NEXT SESSION: Bike to warm up. 2-3 min run and 2 min walk x  5. Do level 2 drillls from CU return to running protocol: lateral shuffles, grapevines, boxer shuffles, backward shuffle, lateral skip, tap skip     3 x 50 ft for all     Maryanne Finder, PT, DPT Physical Therapist - [place]  [facility]

## 2024-07-28 ENCOUNTER — Encounter: Payer: Self-pay | Admitting: Radiology

## 2024-07-30 ENCOUNTER — Encounter: Payer: Self-pay | Admitting: Physical Therapy

## 2024-07-30 ENCOUNTER — Ambulatory Visit: Payer: Self-pay | Attending: Orthopaedic Surgery

## 2024-07-30 DIAGNOSIS — M25661 Stiffness of right knee, not elsewhere classified: Secondary | ICD-10-CM | POA: Diagnosis present

## 2024-07-30 DIAGNOSIS — M25561 Pain in right knee: Secondary | ICD-10-CM | POA: Insufficient documentation

## 2024-07-30 DIAGNOSIS — R262 Difficulty in walking, not elsewhere classified: Secondary | ICD-10-CM | POA: Diagnosis present

## 2024-07-30 NOTE — Therapy (Signed)
 OUTPATIENT PHYSICAL THERAPY TREATMENT & DISCHARGE   Patient Name: Gregory Banks. MRN: [id number] DOB:Dec 03, 1965, 58 y.o., male Today's Date: 07/30/2024  END OF SESSION:  PT End of Session - 07/30/24 0946     Visit Number 30    Number of Visits 35    Date for Recertification  08/22/24    Authorization Type Medicare primary; [employer] commercial 2ndary    Authorization - Number of Visits 35    Progress Note Due on Visit 40    PT Start Time 0946    PT Stop Time 1010    PT Time Calculation (min) 24 min    Activity Tolerance Patient tolerated treatment well;No increased pain    Behavior During Therapy WFL for tasks assessed/performed                  Past Medical History:  Diagnosis Date   Arthritis    knees, rt shoulder   Hyperlipidemia    Hypertension    MI (myocardial infarction) [facility])    Past Surgical History:  Procedure Laterality Date   CARDIAC CATHETERIZATION     Angioplasty with stent placement x 2   COLONOSCOPY WITH PROPOFOL  N/A 06/01/2017   Procedure: COLONOSCOPY WITH PROPOFOL ;  Surgeon: Therisa Bi, MD;  Location: [facility] ENDOSCOPY;  Service: Gastroenterology;  Laterality: N/A;   KNEE ARTHROSCOPY WITH MENISCAL REPAIR Right 02/26/2024   Procedure: ARTHROSCOPY, KNEE, WITH MENISCUS REPAIR;  Surgeon: Genelle Standing, MD;  Location: [facility];  Service: Orthopedics;  Laterality: Right;  RIGHT KNEE ARTHROSCOPY WITH LATERAL MENISCUS REPAIR   OTHER SURGICAL HISTORY     2 Cardiac stents   TUMOR REMOVAL     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   Patient Active Problem List   Diagnosis Date Noted   Complex tear of lateral meniscus of right knee as current injury 02/26/2024   Hyperlipidemia    MI (myocardial infarction) (HCC)    Intracranial mass 01/27/2015   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   Anxiety 03/04/2014   Chronic coronary artery disease 03/04/2014   Depression 03/04/2014   Hypertension 03/04/2014   Shortness of breath 03/04/2014   PCP: Epifanio Alm SQUIBB, MD   REFERRING PROVIDER: [name] Genelle, MD (orthopedics)  REFERRING DIAG: s/p Rt latera meniscus repair  THERAPY DIAG:  Acute pain of right knee  Stiffness of right knee, not elsewhere classified  Difficulty in walking, not elsewhere classified  Rationale for Evaluation and Treatment: Rehabilitation  ONSET DATE: 02/26/24  SUBJECTIVE:   SUBJECTIVE STATEMENT:   Denies pain  PERTINENT HISTORY: Knee pain and locking following a day of playing 4 basketball games, meniscus injury seen. Pt underwent lateral meniscus repair with Dr. Genelle on 6/3, placed in locked in extension, WBAT. At evaluation current pain: 6/10; Best pain: 0/10 Worst Pain: 6/10, on average has been around 4 PAIN:  Are you having pain? 0/10   PRECAUTIONS: WBAT, locked brace at all times when walking.  WEIGHT BEARING RESTRICTIONS: WBAT  FALLS:  Has patient fallen in last 6 months? No  LIVING ENVIRONMENT: Lives with: Wife Lives in: house  Stairs: 17 stairs to 2nd floor bedroom, no entry stairs  Has following equipment at home: crutches up to 6'6.   OCCUPATION: Retired   PLOF: Coaches basketball   PATIENT GOALS: Full recovery   NEXT MD VISIT: June 2025   OBJECTIVE:  Note: Objective measures were completed at Evaluation unless otherwise noted.  PATIENT SURVEYS:  LEFS: 20% 04/17/24: 78% (63/80). 06/26/24: 84% (67/80) EDEMA:  Unable to see, still  ace wrapped  LOWER EXTREMITY ROM:  A/PROM Right eval Left eval Right   04/23/24  Right  04/28/24 Right  04/30/24 Right  05/06/24 Right  05/13/24   Hip flexion         Hip extension         Hip abduction         Hip adduction         Hip internal rotation         Hip external rotation         Knee flexion 65 degrees 125 deg  115/NT  117/NT  118/NT 123/NT 125 deg   Knee extension  5 degrees 0 deg    0/NT  0 deg   Knee internal rotation         Knee external rotation         Knee abduction         Knee adduction         Ankle dorsiflexion         Ankle  plantarflexion         Ankle inversion         Ankle eversion          (Blank rows = not tested)      MMT   Right 05/13/2022 Left 05/13/2022  Hip flexion 4 4+  Hip extension    Hip abduction 4 4+  Hip adduction    Hip internal rotation    Hip external rotation    Knee flexion 4+ 4  Knee extension 4+ 4  Ankle dorsiflexion    Ankle plantarflexion    Ankle inversion    Ankle eversion     (Blank rows = not tested)    1RM TESTING (calculated from <4RM, 06/26/2024) Single leg press at [facility], seat position 4 (meant to be 5 but missed) with toe at top of plate:  R: 873.4# (calculated from 3RM 115#) L: 144# (calculated from 2RM 135#) LSI: 87.8%  Single knee extension at [facility] machine, seat position 5 R: 90.7# (calculated from 2RM 85#) L: 115# (actual 1RM) LSI: 78.9%   TODAY'S TREATMENT  07/30/24:    Physical therapy treatment session today consisted of completing assessment of goals and administration of testing as demonstrated and documented in flow sheet, treatment, and goals section of this note. Addition treatments may be found below.   Leg press (single leg): 145# Knee extension (single leg): 100# Single limb hop (on R): 39  Provided patient with gray and black theraband for HEP progression   07/23/24:  TA:  1 min walk (3.0 mph) and 1 min run (4.5 mph)  x 7= 14 min    Lateral leaping 2 x 1 minute  Forward walking lunges 2 x 10 m - alternating  Soldier walks 2 x 20'  Single knee to chest alternating 20'  Sidestepping with BTB around knees 3 x 15' each direction  Quad stretch alternating 20'   07/21/24:   THEREX Dynamic warm up  -Single knee to chest alternating 20 ft  -Quad Stretch alternating 20 ft  -Forward Lunges alternating  20 ft   -Soldier Walks   20 ft    Seated HS Stretch with strap with heel propped on 10 inch step on RLE  3 x 60 sec    Elevated plank from 18 inch mat height 30 sec   THERAC   1 min walk (2.8 mph) and 1 min run (4.5 mph)  x 7=  14 min  Single Leg Squat into Single Leg Heel Raise with 1 UE support on RLE  3 x 10     PATIENT EDUCATION:  Education details: Tissue healing timeline and explanation of car seat position and LE dermatomes  Person educated: Patient and spouse  Education method: collaborative learning, deliberate practice, positive reinforcement, explicit instruction, establish rules. Education comprehension: excellent  HOME EXERCISE PROGRAM: Do not print   Access Code: [facility] URL: [email or web address] Date: 07/21/2024 Prepared by: Toribio Servant  Program Notes Return to running program - walk and 1 min run  x 15 , 3-4  days per week Single Leg Heel Raise on Right with left on step 3 x 20 X 5-7 days per week  Power skip on RLE with Left arm swing though 3 sets of 10 reps 3-4 x per week    Exercises - Prone Quadriceps Stretch with Strap  - 1 x daily - 7 x weekly - 3 reps - 60 sec  hold - Seated Hip External Rotation Stretch  - 1 x daily - 7 x weekly - 3 reps - 60 sec  hold - Lateral Single Leg Lunge Jumps  - 1 x daily - 7 x weekly - 3 sets - 10 reps - Jump Lunge on Right   - 1 x daily - 7 x weekly - 10 reps - Single Leg Jump Rope  - 1 x daily - 7 x weekly - 1 sets - 20 reps - Romanian dead lift to coin jump with tap on 11 inch step   - 3-4 x weekly - 3 sets - 10 reps - Sumo Squat with Dumbbell  - 3-4 x weekly - 3 sets - 10 reps - Forearm to Full Planks  - 1 x daily - 7 x weekly - 5 reps - 30 sec holdps  ASSESSMENT:  CLINICAL IMPRESSION:     Patient has made significant improvements since surgery on R knee. He has all goals with improvement in single leg strength and jump. Continues to demonstrate difference between R and L LE strength, however he has exercise routine and will continue to progress strength independently. Patient in agreement with discharge this date. Patient will be discharged from skilled PT services to independent participation of HEP to further progress BLE  strength and stability.     Decreased knowledge of condition, decreased use of DME, decreased mobility, difficulty walking, decreased strength, decreased ROM. ACTIVITY LIMITATIONS: Lifting, standing, walking, squatting, transfers, locomotion level PARTICIPATION LIMITATIONS: Cleaning, laundry, interpersonal relationships, driving, yardwork, community activity.  PERSONAL FACTORS: Age, behavior pattern, education, past/current experiences, transportation, profession  are also affecting patient's functional outcome.  REHAB POTENTIAL: Great  CLINICAL DECISION MAKING: Great EVALUATION COMPLEXITY: Low  GOALS: Goals reviewed with patient? yes  SHORT TERM GOALS: Target date: 04/05/24 Patient will report comprehension, confidence, and consistent compliance and of a simple home exercise program established to facilitate symptoms management and basic strengthening and/or segment mobility.  Baseline: issued at eval 03/12/24: Able to perform HEP independently  Goal status: ACHIEVED    2.  Patient to demonstrate improved score on self-report measure by >9% to indicate reduced self-reported disability and/or pain.    Baseline: LEFS: 20%   04/17/24: 78% Goal status: ACHIEVED  3.  Pt to demonstrate Rt knee RM 0-90 degrees  Baseline: eval: 5-65 deg 03/20/24: 0-83  04/02/24: 0-90 deg on RLE  Goal status: ACHIEVED   LONG TERM GOALS: Target date: 08/22/24  Pt to demonstrate ability to perform >1552ft without pain limitation,  less than 2/10 increase in NPRS to improve ability to participate in IADL and social events.  Baseline: 1,200 ft 04/30/24: 1650 ft   Goal status: ACHIEVED     2.  Pt to demonstrate less than 10% strength deficit in between bilat knee extension and bilat ankle PF to prepare for return to leisure and community activity.  Baseline: Leg Press 1 RM R/L 80/160  Knee Ext 1 RM R/L  55/115  Single Leg Calf Raise R/L partial heel raise/ full heel raise on LLE 05/20/24:  Leg Press 1 RM R/L 105/160   Knee Ext 1 RM R/L  65/115. 06/26/24: Leg press calculated 1RM  R/L 126.5/144, Knee Ext calculated 1RM R/L 90.7/115, ankle PF testing deferred. 07/16/24: Knee Ext R 100 lbs, Leg Press R  125 lbs; 07/30/24: Knee ext R 100#; Leg Press R 140#  Goal status: ADEQUATE DISCHARGE   3.  Patient to demonstrate improved performance on initial transfers assessment AEB improved reps per time or time per perform desired reps and/or reduced seat height and/or use of hands in order to improve safety, tolerance, and independence in ADL performance.   Baseline: Goal not appropriate for measuring LE endurance for pt's age Goal status: discontinued  4.  Pt to demonstrate <10% difference between single leg hop bilaterally  Baseline: Not able to perform due to restrictions. 06/30/24: Single Leg Hop R/L 35/NT  07/09/24: Starting on RLE and landing on RLE  24 forward hop; 07/30/24: 39 on R; 36 on L (difficulty sequencing) Goal status: ACHIEVED    PLAN:  PT FREQUENCY: 1-2x/week  PT DURATION: 12 weeks PLANNED INTERVENTIONS: 97110-Therapeutic exercises, 97530- Therapeutic activity, 97112- Neuromuscular re-education, 97535- Self Care, 02859- Manual therapy, [phone]- Gait training, [phone]- Electrical stimulation (unattended), [phone]- Electrical stimulation (manual), Patient/Family education, Balance training, Stair training, Joint mobilization, Joint manipulation, Spinal manipulation, and Spinal mobilization   Maryanne Finder, PT, DPT Physical Therapist - [place]  [facility]

## 2024-09-14 ENCOUNTER — Other Ambulatory Visit: Payer: Self-pay

## 2024-09-14 DIAGNOSIS — R059 Cough, unspecified: Secondary | ICD-10-CM | POA: Diagnosis present

## 2024-09-14 DIAGNOSIS — J069 Acute upper respiratory infection, unspecified: Secondary | ICD-10-CM | POA: Diagnosis not present

## 2024-09-14 LAB — RESP PANEL BY RT-PCR (RSV, FLU A&B, COVID)  RVPGX2
Influenza A by PCR: NEGATIVE
Influenza B by PCR: NEGATIVE
Resp Syncytial Virus by PCR: NEGATIVE
SARS Coronavirus 2 by RT PCR: NEGATIVE

## 2024-09-14 NOTE — ED Triage Notes (Signed)
 Pt reports cough and body aches x 1 days. No chest pain.

## 2024-09-15 ENCOUNTER — Emergency Department
Admission: EM | Admit: 2024-09-15 | Discharge: 2024-09-15 | Disposition: A | Attending: Emergency Medicine | Admitting: Emergency Medicine

## 2024-09-15 ENCOUNTER — Emergency Department

## 2024-09-15 DIAGNOSIS — J069 Acute upper respiratory infection, unspecified: Secondary | ICD-10-CM | POA: Diagnosis not present

## 2024-09-15 DIAGNOSIS — J111 Influenza due to unidentified influenza virus with other respiratory manifestations: Secondary | ICD-10-CM

## 2024-09-15 MED ORDER — BENZONATATE 100 MG PO CAPS: 100.0000 mg | ORAL_CAPSULE | Freq: Three times a day (TID) | ORAL | 0 refills | Status: AC | PRN

## 2024-09-15 MED ORDER — HYDROCOD POLI-CHLORPHE POLI ER 10-8 MG/5ML PO SUER: 5.0000 mL | Freq: Two times a day (BID) | ORAL | 0 refills | Status: AC | PRN

## 2024-09-15 MED ORDER — BENZONATATE 100 MG PO CAPS
200.0000 mg | ORAL_CAPSULE | Freq: Once | ORAL | Status: AC
  Administered 2024-09-15: 200 mg via ORAL
  Filled 2024-09-15: qty 2

## 2024-09-15 NOTE — Discharge Instructions (Signed)
 Even though you tested negative for influenza tonight, given that your family tested positive, I think it is very likely you have the flu, or at least a viral illness that has the same symptoms.  Please drink plenty of fluids to stay hydrated, use over-the-counter ibuprofen  and/or Tylenol  as needed for pain and fever control, and use the prescribed cough medicine according to label instructions (it is recommended to only use at night since it may make you sleepy and you should not drive after taking it).  Follow-up with your regular doctor as needed

## 2024-09-15 NOTE — ED Provider Notes (Signed)
 "  [facility] Provider Note    Event Date/Time   First MD Initiated Contact with Patient 09/15/24 0036     (approximate)   History   Cough   HPI Gregory Banks. is a 58 y.o. male who presents for evaluation of bodyaches, nasal congestion, and cough.  Symptoms been going on as of today.  He brought his wife and daughter to the emergency department less than 24 hours ago for similar symptoms and they were diagnosed with influenza A.  His symptoms started tonight and his wife insisted that he come get checked out.  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  His cough bothers him a little bit as well.  No nausea or vomiting.  No shortness of breath other than while coughing.     Physical Exam   Triage Vital Signs: ED Triage Vitals  Encounter Vitals Group     BP 09/14/24 2229 (!) 156/95     Girls Systolic BP Percentile --      Girls Diastolic BP Percentile --      Boys Systolic BP Percentile --      Boys Diastolic BP Percentile --      Pulse Rate 09/14/24 2229 85     Resp 09/14/24 2229 20     Temp 09/14/24 2229 98.4 F (36.9 C)     Temp Source 09/14/24 2229 Oral     SpO2 09/14/24 2229 99 %     Weight --      Height --      Head Circumference --      Peak Flow --      Pain Score 09/14/24 2227 4     Pain Loc --      Pain Education --      Exclude from Growth Chart --     Most recent vital signs: Vitals:   09/14/24 2229  BP: (!) 156/95  Pulse: 85  Resp: 20  Temp: 98.4 F (36.9 C)  SpO2: 99%    General: Awake, no distress.  Well-appearing, pleasant and conversant. CV:  Good peripheral perfusion.  Normal heart sounds, regular rate and rhythm. Resp:  Normal effort. Speaking easily and comfortably, no accessory muscle usage nor intercostal retractions.  Lungs are clear to auscultation  bilaterally. Abd:  No distention.    ED Results / Procedures / Treatments   Labs (all labs ordered are listed, but only abnormal results are displayed) Labs Reviewed  RESP PANEL BY RT-PCR (RSV, FLU A&B, COVID)  [id number]     RADIOLOGY I independently viewed and interpret the patient's two-view chest x-ray and I see no evidence of pneumonia.  I also read the radiologist's report, which confirmed no acute findings.   PROCEDURES:  Critical Care performed: No  Procedures    IMPRESSION / MDM / ASSESSMENT AND PLAN / ED COURSE  I reviewed the triage vital signs and the nursing notes.                              Differential diagnosis includes, but is not limited to, viral illness, commune acquired pneumonia, much less likely ACS or PE.  Patient's presentation is most consistent with acute presentation with potential threat to life or bodily function.  Labs/studies ordered: Two-view chest x-ray, respiratory viral panel  Interventions/Medications given:  Medications  benzonatate  (TESSALON ) capsule 200 mg (has no administration in time range)    (Note:  hospital course my include additional interventions and/or labs/studies not listed above.)   Low risk of ACS based on HE score, Wells score for PE is 0.  His family tested positive for influenza; even though his test is negative, I think it is very likely it is a false negative given that his symptoms correlate very well with influenza.  Reassuring x-ray.  Treating conservatively with OTC medication and prescription for Tussionex.  He will follow-up as an outpatient.    The patient's medical screening exam is reassuring with no indication of an emergent medical condition requiring hospitalization or additional evaluation at this point.  The patient is safe and appropriate for discharge and outpatient follow up.         FINAL CLINICAL IMPRESSION(S) / ED DIAGNOSES   Final diagnoses:  Influenza-like illness  Viral URI with  cough     Rx / DC Orders   ED Discharge Orders          Ordered    chlorpheniramine-HYDROcodone  (TUSSIONEX) 10-8 MG/5ML  Every 12 hours PRN        09/15/24 0156    benzonatate  (TESSALON ) 100 MG capsule  3 times daily PRN        09/15/24 0156             Note:  This document was prepared using Dragon voice recognition software and may include unintentional dictation errors.   Gordan Huxley, MD 09/15/24 [phone]  "

## 2024-09-26 ENCOUNTER — Other Ambulatory Visit: Payer: Self-pay | Admitting: Infectious Diseases

## 2024-09-26 DIAGNOSIS — R3129 Other microscopic hematuria: Secondary | ICD-10-CM

## 2024-10-16 ENCOUNTER — Ambulatory Visit
Admission: RE | Admit: 2024-10-16 | Discharge: 2024-10-16 | Disposition: A | Discharge status: Home / Self Care | Source: Ambulatory Visit | Attending: Infectious Diseases | Admitting: Infectious Diseases

## 2024-10-16 DIAGNOSIS — R3129 Other microscopic hematuria: Secondary | ICD-10-CM | POA: Diagnosis present

## 2024-10-18 ENCOUNTER — Encounter (INDEPENDENT_AMBULATORY_CARE_PROVIDER_SITE_OTHER): Payer: Self-pay
